# Patient Record
Sex: Male | Born: 1985 | Hispanic: Yes | Marital: Married | Smoking: Never smoker
Health system: Southern US, Community
[De-identification: ages and names within clinical notes are randomized; demographics above are authoritative.]

## PROBLEM LIST (undated history)

## (undated) DIAGNOSIS — I2510 Atherosclerotic heart disease of native coronary artery without angina pectoris: Secondary | ICD-10-CM

---

## 2021-08-23 ENCOUNTER — Emergency Department (HOSPITAL_COMMUNITY): Payer: Self-pay

## 2021-08-23 ENCOUNTER — Encounter (HOSPITAL_COMMUNITY): Payer: Self-pay | Admitting: Emergency Medicine

## 2021-08-23 ENCOUNTER — Inpatient Hospital Stay (HOSPITAL_COMMUNITY)
Admission: EM | Admit: 2021-08-23 | Discharge: 2021-08-25 | DRG: 885 | Disposition: A | Discharge status: Home / Self Care | Payer: Self-pay | Attending: Internal Medicine | Admitting: Internal Medicine

## 2021-08-23 ENCOUNTER — Other Ambulatory Visit: Payer: Self-pay

## 2021-08-23 DIAGNOSIS — T1491XA Suicide attempt, initial encounter: Secondary | ICD-10-CM

## 2021-08-23 DIAGNOSIS — M4802 Spinal stenosis, cervical region: Secondary | ICD-10-CM | POA: Diagnosis present

## 2021-08-23 DIAGNOSIS — R413 Other amnesia: Secondary | ICD-10-CM

## 2021-08-23 DIAGNOSIS — Z20822 Contact with and (suspected) exposure to covid-19: Secondary | ICD-10-CM | POA: Diagnosis present

## 2021-08-23 DIAGNOSIS — R200 Anesthesia of skin: Secondary | ICD-10-CM

## 2021-08-23 DIAGNOSIS — R569 Unspecified convulsions: Secondary | ICD-10-CM | POA: Diagnosis present

## 2021-08-23 DIAGNOSIS — F23 Brief psychotic disorder: Principal | ICD-10-CM | POA: Diagnosis present

## 2021-08-23 DIAGNOSIS — W19XXXA Unspecified fall, initial encounter: Secondary | ICD-10-CM | POA: Diagnosis present

## 2021-08-23 DIAGNOSIS — F29 Unspecified psychosis not due to a substance or known physiological condition: Secondary | ICD-10-CM | POA: Diagnosis present

## 2021-08-23 DIAGNOSIS — M4804 Spinal stenosis, thoracic region: Secondary | ICD-10-CM | POA: Diagnosis present

## 2021-08-23 DIAGNOSIS — Y900 Blood alcohol level of less than 20 mg/100 ml: Secondary | ICD-10-CM | POA: Diagnosis present

## 2021-08-23 DIAGNOSIS — R4182 Altered mental status, unspecified: Secondary | ICD-10-CM

## 2021-08-23 DIAGNOSIS — F17210 Nicotine dependence, cigarettes, uncomplicated: Secondary | ICD-10-CM | POA: Diagnosis present

## 2021-08-23 DIAGNOSIS — R440 Auditory hallucinations: Secondary | ICD-10-CM

## 2021-08-23 DIAGNOSIS — Z9181 History of falling: Secondary | ICD-10-CM

## 2021-08-23 DIAGNOSIS — M48061 Spinal stenosis, lumbar region without neurogenic claudication: Secondary | ICD-10-CM | POA: Diagnosis present

## 2021-08-23 DIAGNOSIS — F1020 Alcohol dependence, uncomplicated: Secondary | ICD-10-CM | POA: Diagnosis present

## 2021-08-23 DIAGNOSIS — G935 Compression of brain: Secondary | ICD-10-CM | POA: Diagnosis present

## 2021-08-23 DIAGNOSIS — Z9151 Personal history of suicidal behavior: Secondary | ICD-10-CM

## 2021-08-23 DIAGNOSIS — I2510 Atherosclerotic heart disease of native coronary artery without angina pectoris: Secondary | ICD-10-CM

## 2021-08-23 DIAGNOSIS — Y929 Unspecified place or not applicable: Secondary | ICD-10-CM

## 2021-08-23 DIAGNOSIS — S0003XA Contusion of scalp, initial encounter: Secondary | ICD-10-CM | POA: Diagnosis present

## 2021-08-23 DIAGNOSIS — M4800 Spinal stenosis, site unspecified: Secondary | ICD-10-CM

## 2021-08-23 DIAGNOSIS — R519 Headache, unspecified: Secondary | ICD-10-CM

## 2021-08-23 HISTORY — DX: Atherosclerotic heart disease of native coronary artery without angina pectoris: I25.10

## 2021-08-23 LAB — RAPID URINE DRUG SCREEN, HOSP PERFORMED
Amphetamines: NOT DETECTED
Barbiturates: NOT DETECTED
Benzodiazepines: NOT DETECTED
Cocaine: NOT DETECTED
Opiates: NOT DETECTED
Tetrahydrocannabinol: NOT DETECTED

## 2021-08-23 LAB — COMPREHENSIVE METABOLIC PANEL
ALT: 18 U/L (ref 0–44)
AST: 19 U/L (ref 15–41)
Albumin: 3.9 g/dL (ref 3.5–5.0)
Alkaline Phosphatase: 63 U/L (ref 38–126)
Anion gap: 7 (ref 5–15)
BUN: 9 mg/dL (ref 6–20)
CO2: 26 mmol/L (ref 22–32)
Calcium: 8.7 mg/dL — ABNORMAL LOW (ref 8.9–10.3)
Chloride: 105 mmol/L (ref 98–111)
Creatinine, Ser: 0.85 mg/dL (ref 0.61–1.24)
GFR, Estimated: >60 mL/min (ref >60)
Glucose, Bld: 77 mg/dL (ref 70–99)
Potassium: 3.8 mmol/L (ref 3.5–5.1)
Sodium: 138 mmol/L (ref 135–145)
Total Bilirubin: 0.8 mg/dL (ref 0.3–1.2)
Total Protein: 6.8 g/dL (ref 6.5–8.1)

## 2021-08-23 LAB — SALICYLATE LEVEL: Salicylate Lvl: <7 mg/dL — ABNORMAL LOW (ref 7.0–30.0)

## 2021-08-23 LAB — CBC WITH DIFFERENTIAL/PLATELET
Abs Immature Granulocytes: 0.02 10*3/uL (ref 0.00–0.07)
Basophils Absolute: 0 10*3/uL (ref 0.0–0.1)
Basophils Relative: 0 %
Eosinophils Absolute: 0 10*3/uL (ref 0.0–0.5)
Eosinophils Relative: 0 %
HCT: 43.1 % (ref 39.0–52.0)
Hemoglobin: 14.3 g/dL (ref 13.0–17.0)
Immature Granulocytes: 0 %
Lymphocytes Relative: 20 %
Lymphs Abs: 1.6 10*3/uL (ref 0.7–4.0)
MCH: 30.4 pg (ref 26.0–34.0)
MCHC: 33.2 g/dL (ref 30.0–36.0)
MCV: 91.5 fL (ref 80.0–100.0)
Monocytes Absolute: 0.6 10*3/uL (ref 0.1–1.0)
Monocytes Relative: 8 %
Neutro Abs: 5.4 10*3/uL (ref 1.7–7.7)
Neutrophils Relative %: 72 %
Platelets: 348 10*3/uL (ref 150–400)
RBC: 4.71 MIL/uL (ref 4.22–5.81)
RDW: 11.7 % (ref 11.5–15.5)
WBC: 7.7 10*3/uL (ref 4.0–10.5)
nRBC: 0 % (ref 0.0–0.2)

## 2021-08-23 LAB — ACETAMINOPHEN LEVEL: Acetaminophen (Tylenol), Serum: <10 ug/mL — ABNORMAL LOW (ref 10–30)

## 2021-08-23 LAB — RESP PANEL BY RT-PCR (FLU A&B, COVID) ARPGX2
Influenza A by PCR: NEGATIVE
Influenza B by PCR: NEGATIVE
SARS Coronavirus 2 by RT PCR: NEGATIVE

## 2021-08-23 LAB — ETHANOL: Alcohol, Ethyl (B): <10 mg/dL (ref <10)

## 2021-08-23 LAB — HIV ANTIBODY (ROUTINE TESTING W REFLEX): HIV Screen 4th Generation wRfx: NONREACTIVE

## 2021-08-23 LAB — AMMONIA: Ammonia: 29 umol/L (ref 9–35)

## 2021-08-23 IMAGING — CT CT HEAD W/O CM
4 series · 17 of 47 positions shown, 19 images · non-contrast
Comparison: None.

CLINICAL DATA: AMS s/p fall

EXAM:
CT HEAD WITHOUT CONTRAST
TECHNIQUE: Contiguous axial images were obtained from the base of the skull
through the vertex without intravenous contrast.

[Series 3: head bone · axial · 0.41mm/px · z∈[+1246,+1302]mm · 4 of 81 slices shown]
[im 9/81  bone]
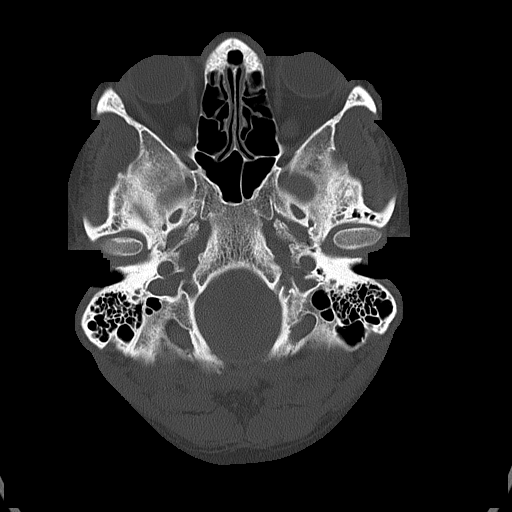
[im 17/81  bone]
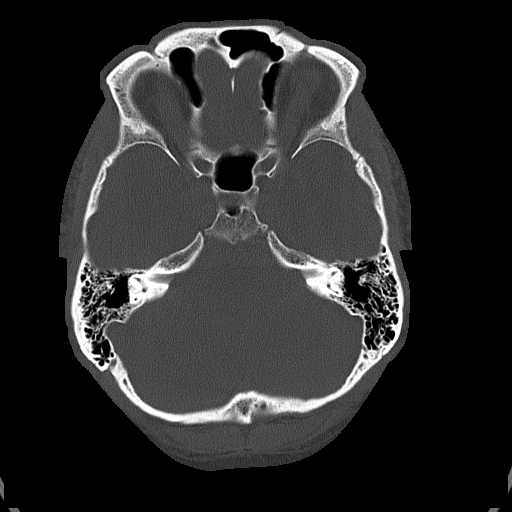
[im 25/81  bone]
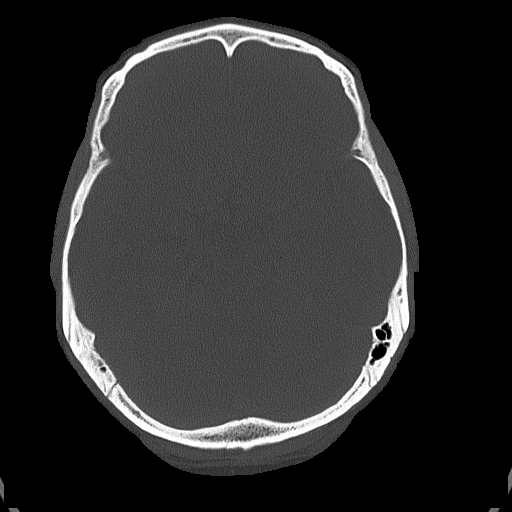
[im 37/81  bone]
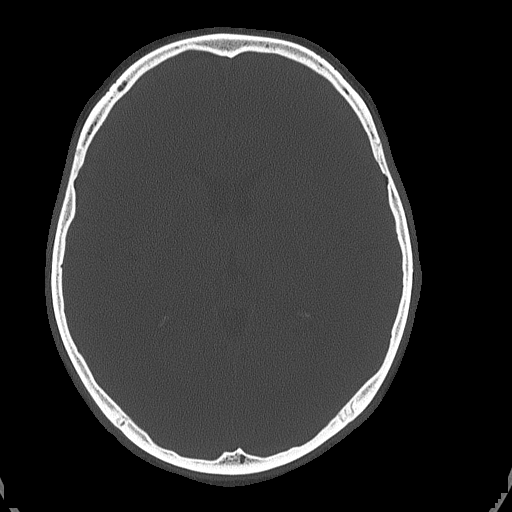

[Series 4: head without · axial · non-contrast · 0.41mm/px · z∈[+1250,+1370]mm · 7 of 33 slices shown, 9 images]
[im 5/33  brain]
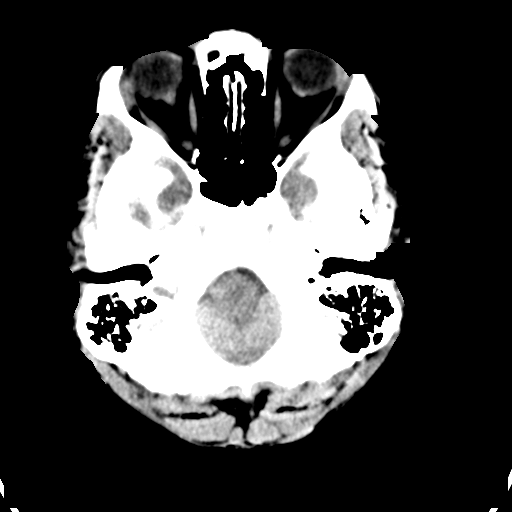
[im 5/33  bone]
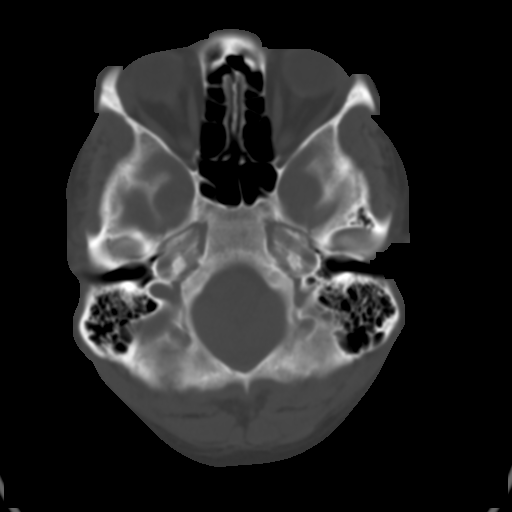
[im 9/33  brain]
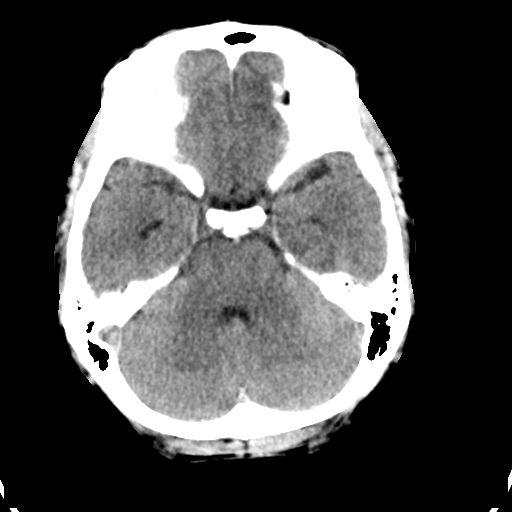
[im 13/33  brain]
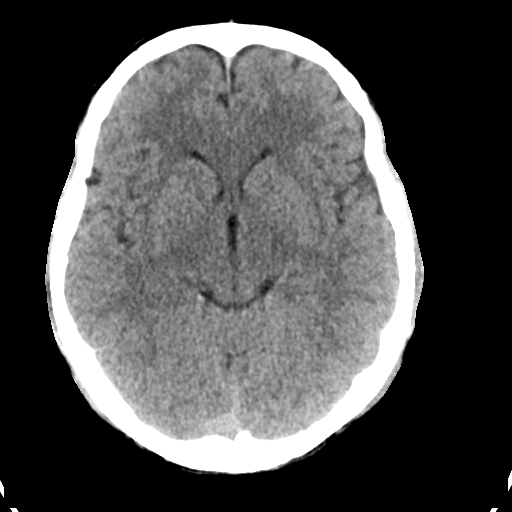
[im 17/33  brain]
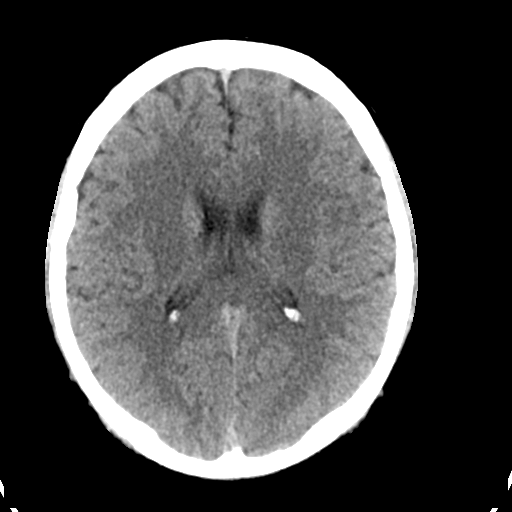
[im 21/33  brain]
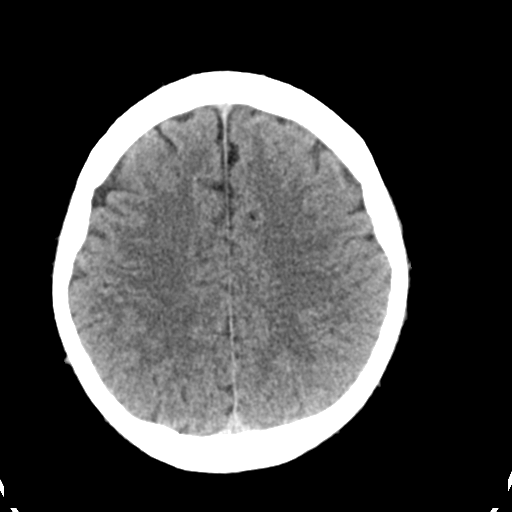
[im 21/33  bone]
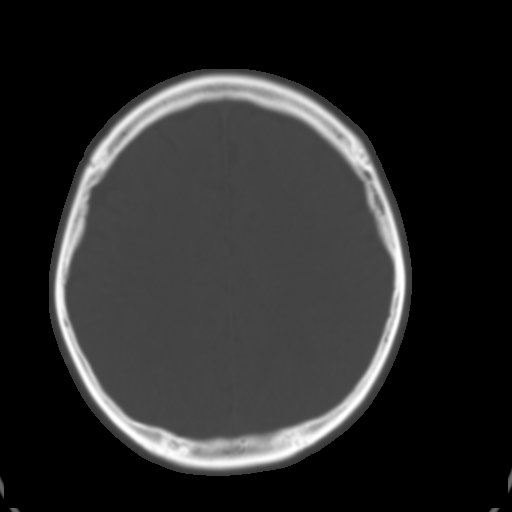
[im 25/33  brain]
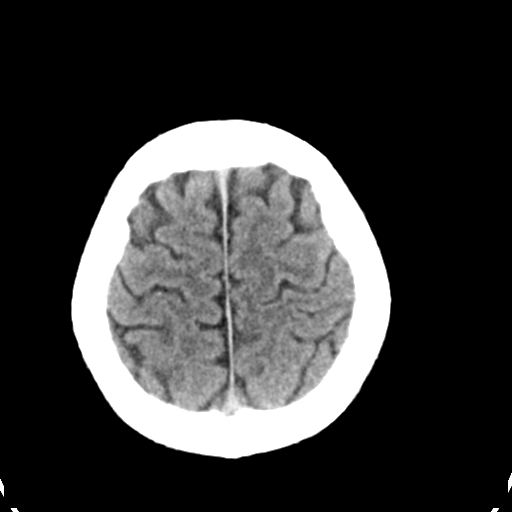
[im 29/33  brain]
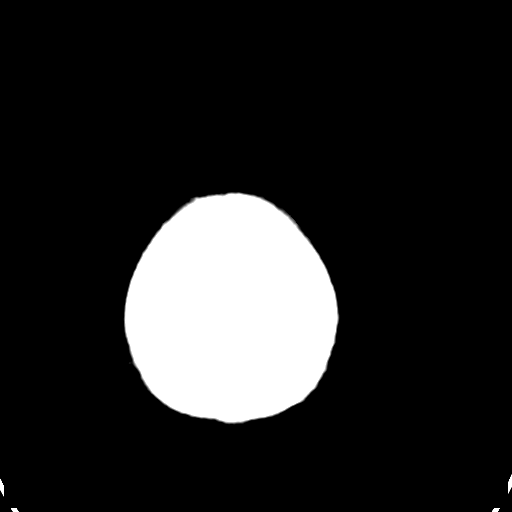

[Series 5: head without cor · coronal · non-contrast · 0.32mm/px · 3 of 66 slices shown]
[im 22/66  brain]
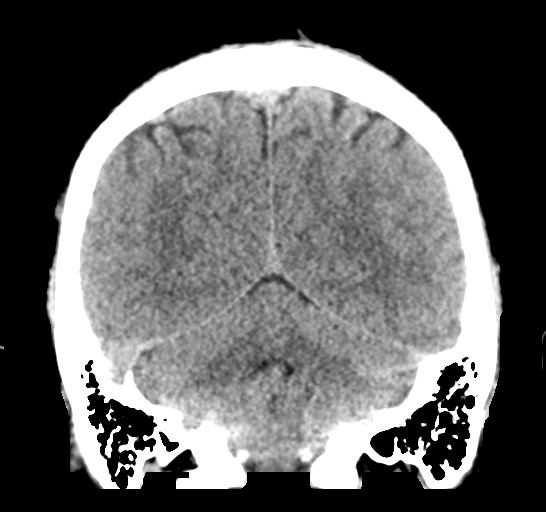
[im 29/66  brain]
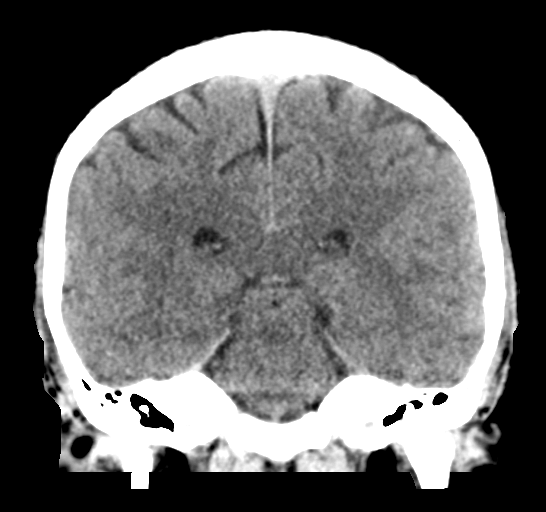
[im 37/66  brain]
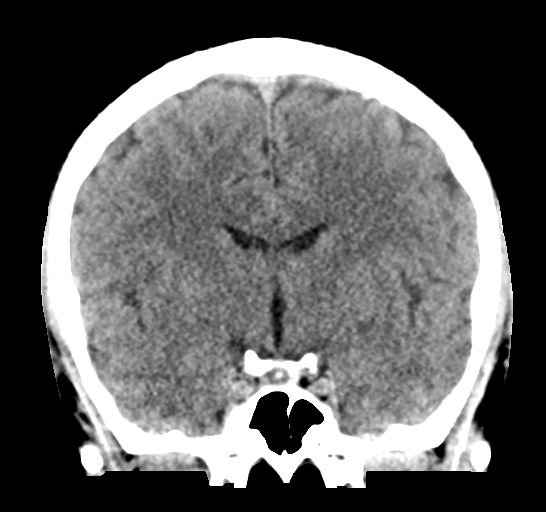

[Series 6: head without sag · sagittal · non-contrast · 0.34mm/px · 3 of 56 slices shown]
[im 19/56  brain]
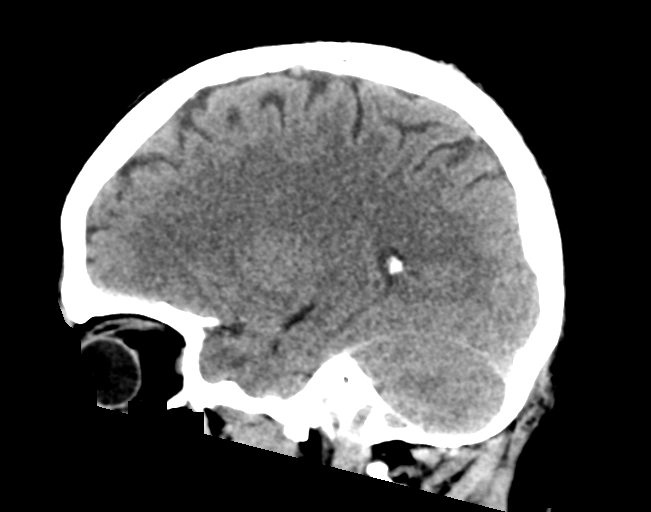
[im 28/56  brain]
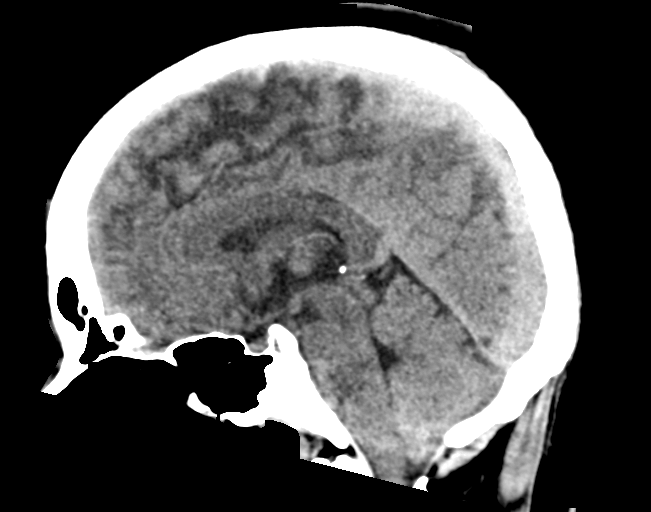
[im 37/56  brain]
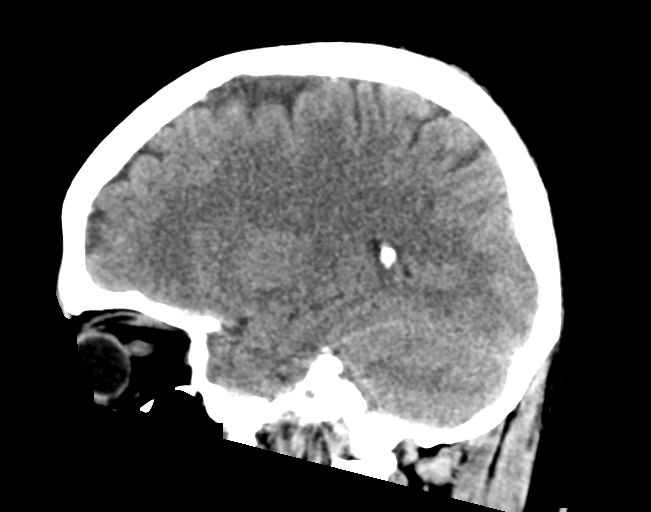

[17 of 47 positions shown; findings below may reference images not displayed]

FINDINGS: Brain: No evidence of acute infarction, hemorrhage, hydrocephalus,
extra-axial collection or mass lesion/mass effect.

Vascular: No hyperdense vessel identified.

Skull: Small right frontal scalp contusion without acute fracture.

Sinuses/Orbits: Clear visualized sinuses.  Unremarkable orbits.

Other: No mastoid effusions.
IMPRESSION: 1. No evidence of acute intracranial abnormality.
2. Small right frontal scalp contusion without acute fracture.

## 2021-08-23 IMAGING — MR MR LUMBAR SPINE W/O CM
4 of 5 series · 19 of 48 positions shown · non-contrast
Comparison: None.

CLINICAL DATA: Low back pain, progressive neurologic deficit

EXAM:
MRI LUMBAR SPINE WITHOUT CONTRAST
TECHNIQUE: Multiplanar, multisequence MR imaging of the lumbar spine was
performed. No intravenous contrast was administered.

[Series 17: T2 · sagittal · 4.0mm · 0.55mm/px · 6 of 15 slices shown (1 of 2)]
[im 1/15]
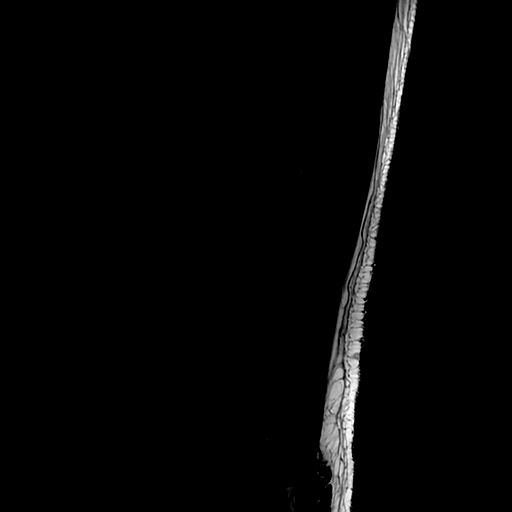
[im 3/15]
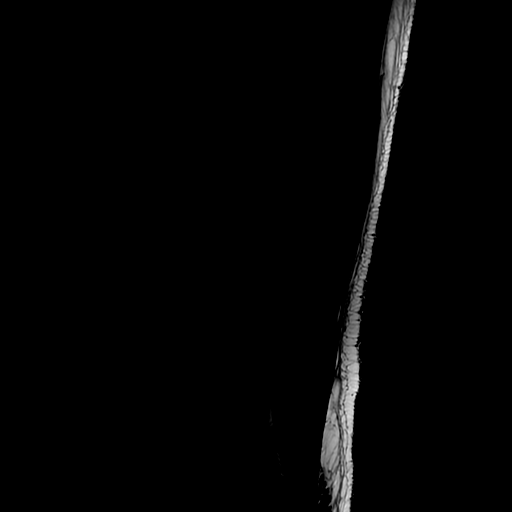
[im 6/15]
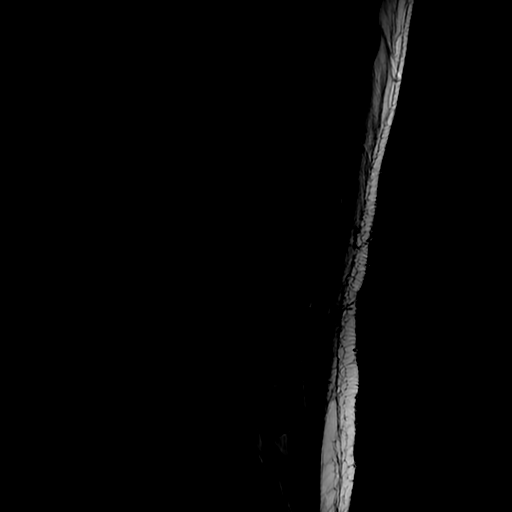
[im 9/15]
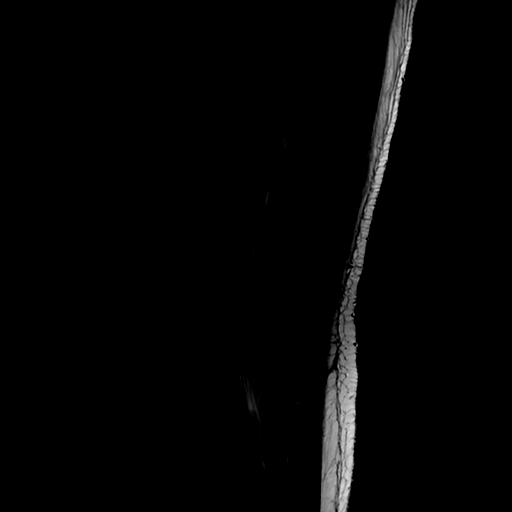
[im 12/15]
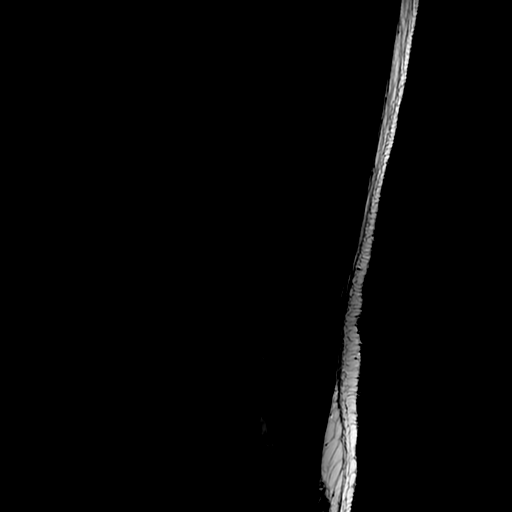
[im 15/15]
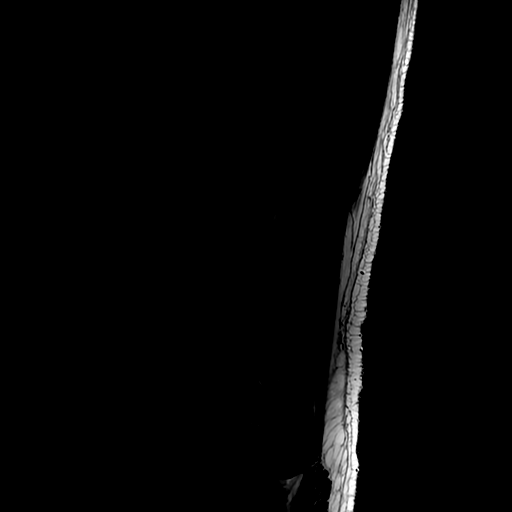

[Series 19: T1 · sagittal · 4.0mm · 0.55mm/px · 3 of 15 slices shown (1 of 2)]
[im 1/15]
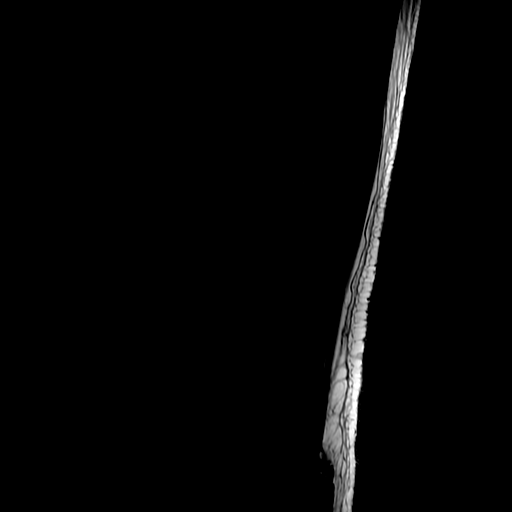
[im 8/15]
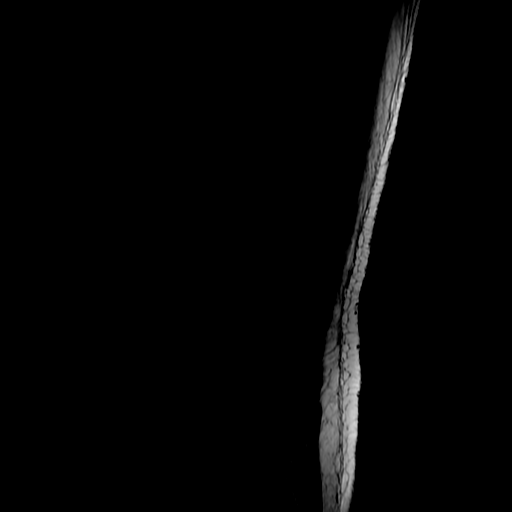
[im 15/15]
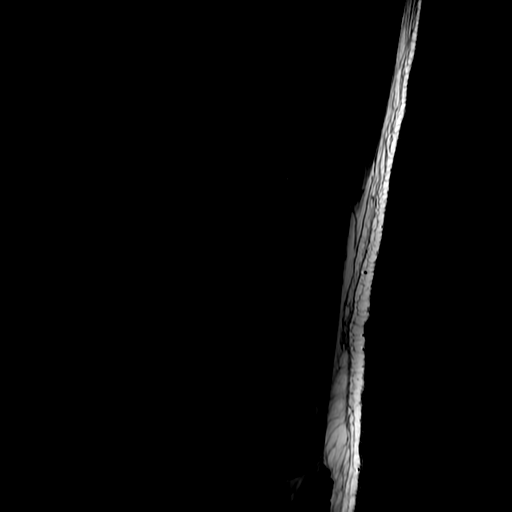

[Series 20: T2 · axial · 4.0mm · 0.39mm/px · z∈[-697,-500]mm · 7 of 43 slices shown (2 of 2)]
[im 3/43]
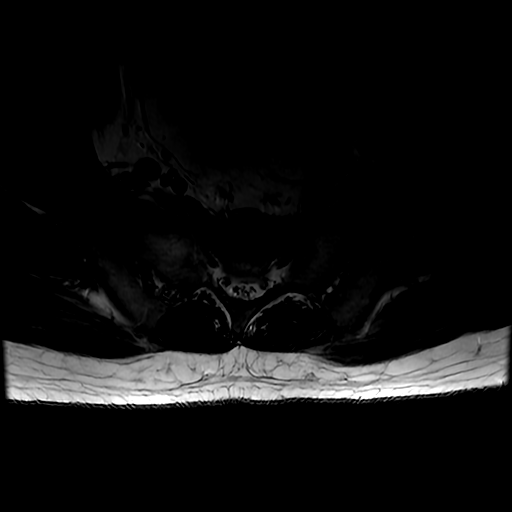
[im 6/43]
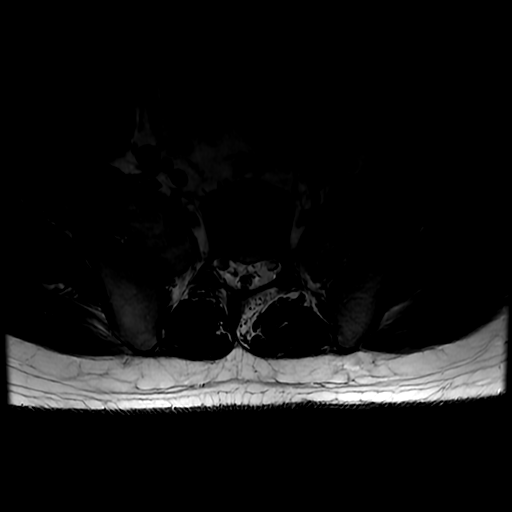
[im 9/43]
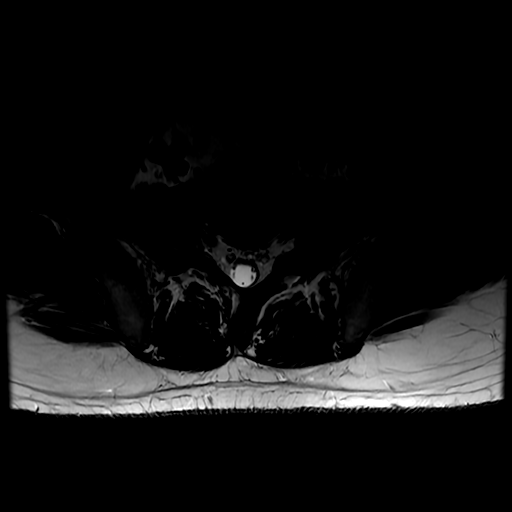
[im 15/43]
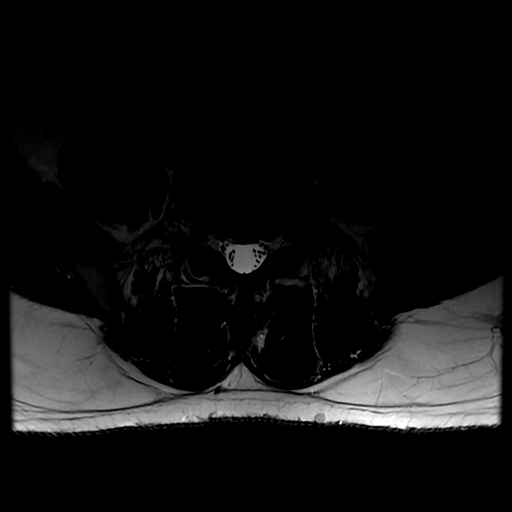
[im 20/43]
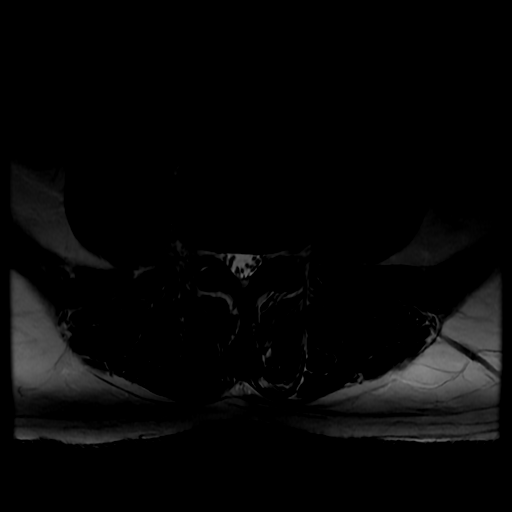
[im 23/43]
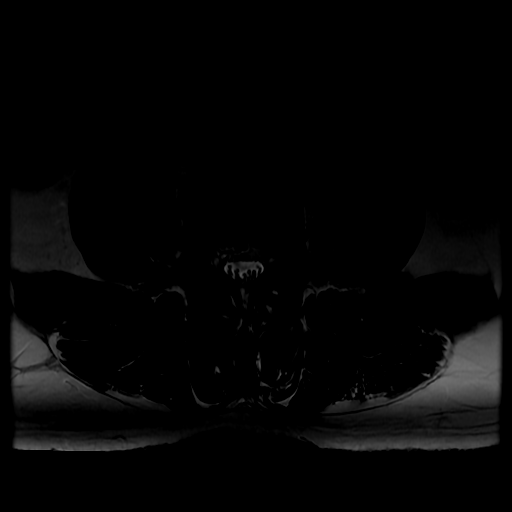
[im 37/43]
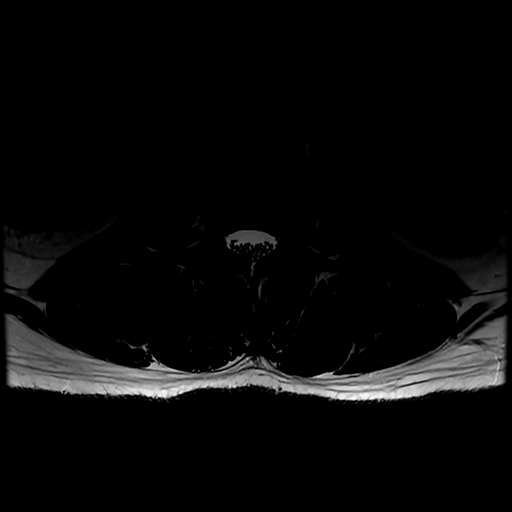

[Series 21: T1 · axial · 4.0mm · 0.39mm/px · z∈[-682,-500]mm · 3 of 43 slices shown (2 of 2)]
[im 6/43]
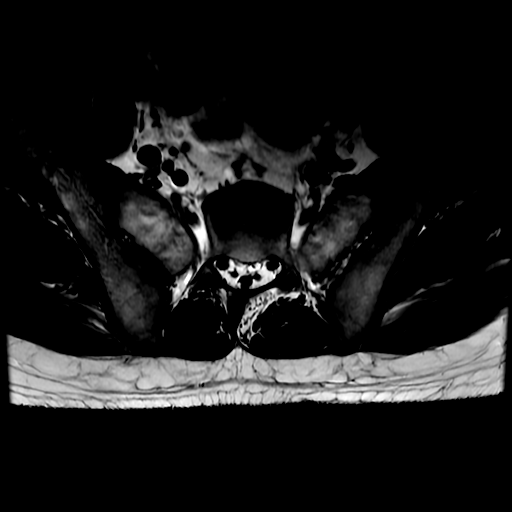
[im 23/43]
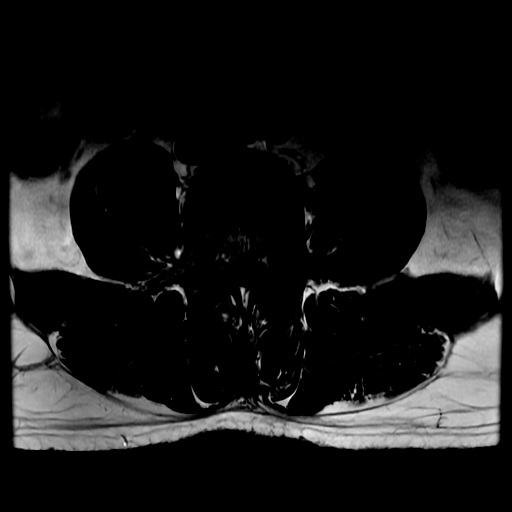
[im 37/43]
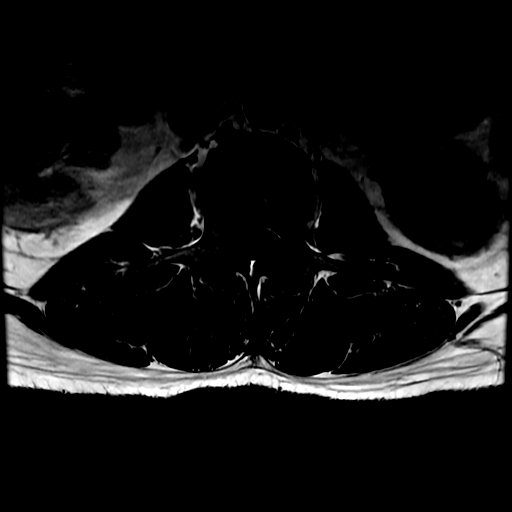

[19 of 48 positions shown; findings below may reference images not displayed]

FINDINGS: Segmentation: Transitional lumbosacral anatomy. For the purposes of
this dictation, there is partially lumbarized S1 segment.

Alignment: Straightening of the normal lumbar lordosis. No
substantial sagittal subluxation.

Vertebrae: Vertebral body heights are maintained. No focal marrow
edema to suggest acute fracture discitis/osteomyelitis. No
suspicious bone lesions.

Conus medullaris and cauda equina: Conus extends to the L1-L2 level.
Conus appears normal.

Paraspinal and other soft tissues: Unremarkable.

Disc levels:

T12-L1: No significant disc protrusion, foraminal stenosis, or canal
stenosis.

L1-L2: No significant disc protrusion, foraminal stenosis, or canal
stenosis.

L2-L3: Slight disc bulging and mild facet arthropathy. No
significant canal stenosis. Mild bilateral foraminal stenosis.

L3-L4: Disc desiccation height loss. Broad disc bulge and mild
bilateral facet hypertrophy. Resulting moderate canal stenosis and
mild bilateral foraminal stenosis.

L4-L5: Mild disc height loss and desiccation. Broad disc bulge and
mild bilateral facet arthropathy. Resulting mild canal stenosis and
mild bilateral foraminal stenosis.

L5-S1: No significant disc protrusion, foraminal stenosis, or canal
stenosis.
IMPRESSION: 1. Transitional lumbosacral anatomy. For the purposes of this
dictation, there is partially lumbarized S1 segment. Correlation
with radiographs is recommended prior to any operative intervention.
2. Moderate canal stenosis at L3-L4.  Mild canal stenosis at L4-L5.
3. Mild bilateral foraminal stenosis at L2-L3, L3-L4, and L4-L5.

## 2021-08-23 IMAGING — MR MR HEAD WO/W CM
7 of 13 series · 23 of 48 positions shown · IV contrast (gadavist)
Comparison: CT Head from the same day

CLINICAL DATA: Neuro deficit, acute, stroke suspected

EXAM:
MRI HEAD WITHOUT AND WITH CONTRAST
TECHNIQUE: Multiplanar, multiecho pulse sequences of the brain and surrounding
structures were obtained without and with intravenous contrast.
CONTRAST:  7mL GADAVIST GADOBUTROL 1 MMOL/ML IV SOLN

[Series 2: DWI · axial · 3.0mm · 0.94mm/px · z∈[-49,+104]mm · 7 of 103 slices shown (1 of 2)]
[im 1/103]
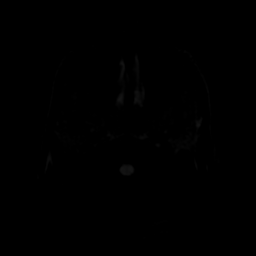
[im 18/103]
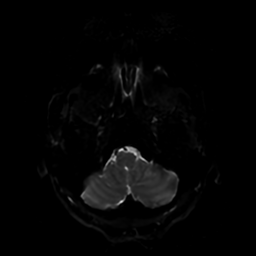
[im 35/103]
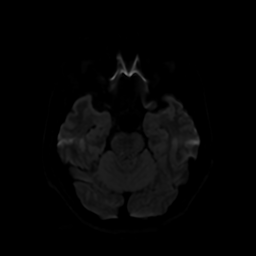
[im 52/103]
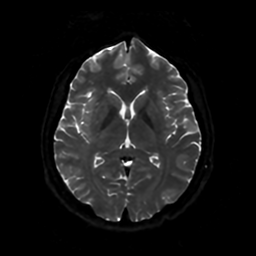
[im 69/103]
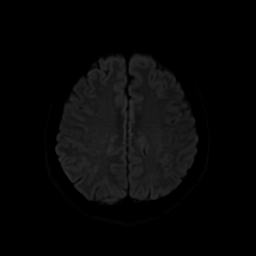
[im 86/103]
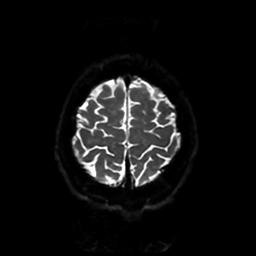
[im 103/103]
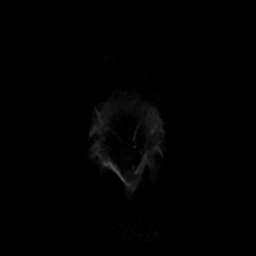

[Series 3: DWI · coronal · 4.0mm · 0.94mm/px · 5 of 76 slices shown (2 of 2)]
[im 1/76]
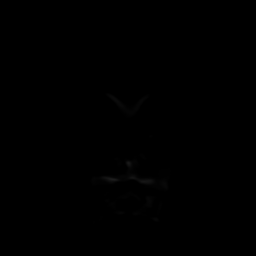
[im 19/76]
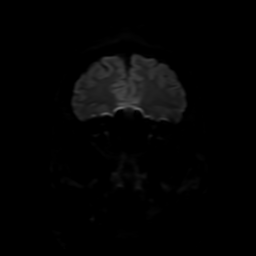
[im 38/76]
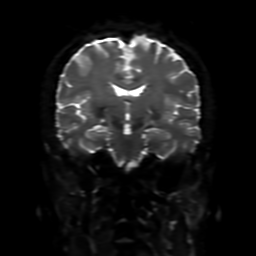
[im 57/76]
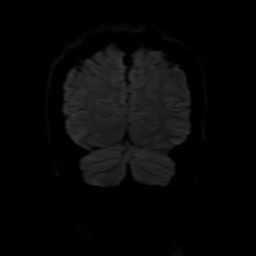
[im 76/76]
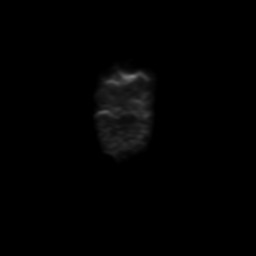

[Series 4: FLAIR · sagittal · 5.0mm · 0.23mm/px · 2 of 23 slices shown (1 of 2)]
[im 1/23]
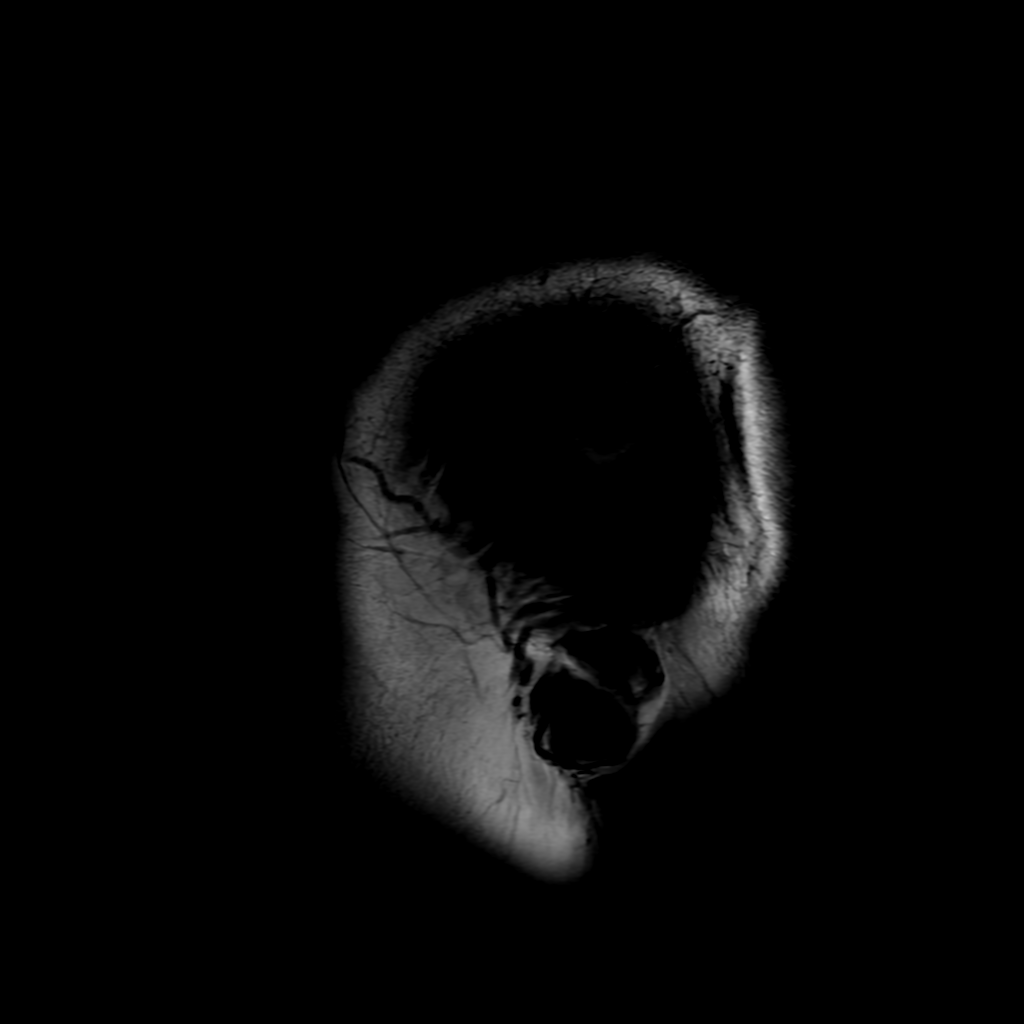
[im 23/23]
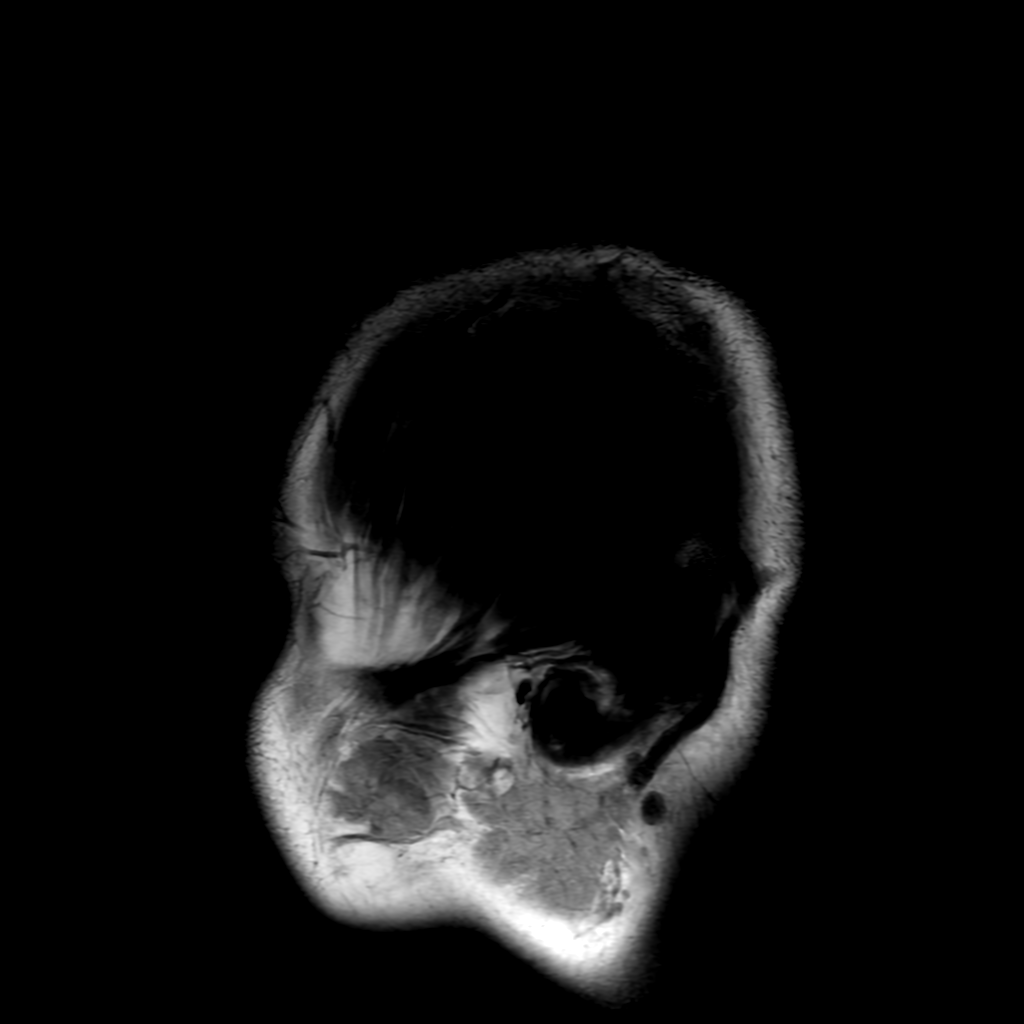

[Series 5: T2 · axial · 5.0mm · 0.23mm/px · 1 of 26 slices shown]
[im 1/26]
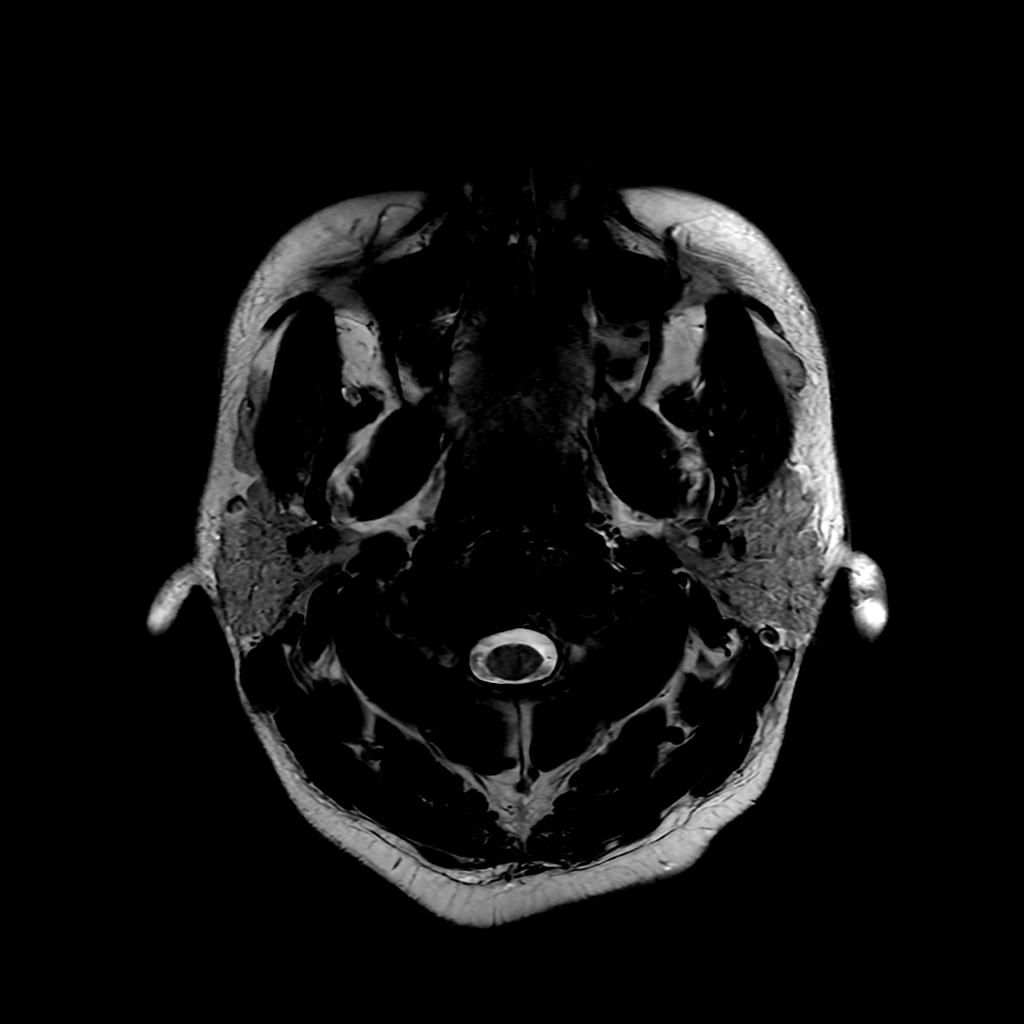

[Series 6: FLAIR · axial · 4.0mm · 0.47mm/px · z∈[-59,+91]mm · 2 of 35 slices shown (2 of 2)]
[im 1/35]
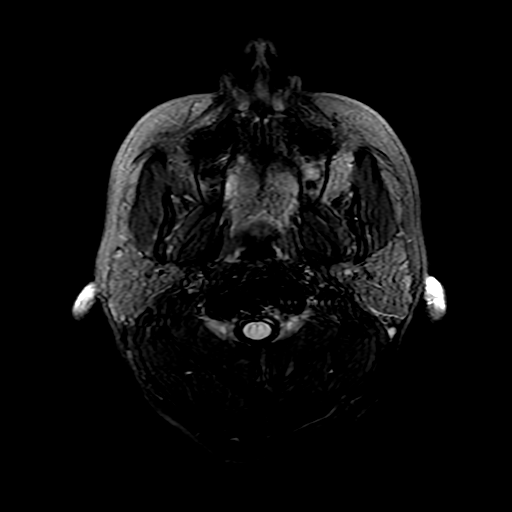
[im 35/35]
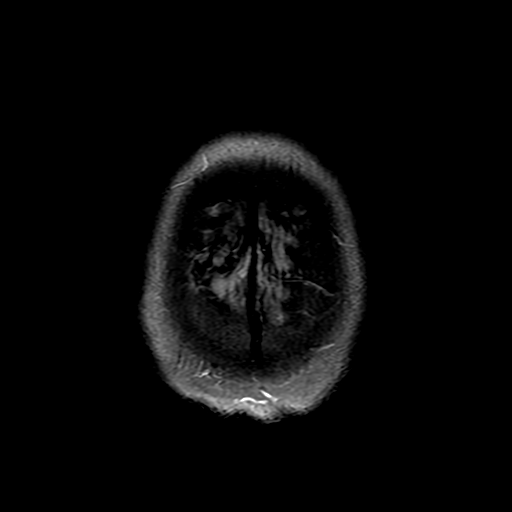

[Series 250: ADC · axial · 3.0mm · 0.94mm/px · z∈[-49,+104]mm · 3 of 52 slices shown (1 of 2)]
[im 1/52]
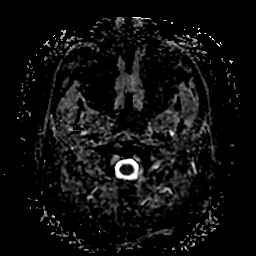
[im 26/52]
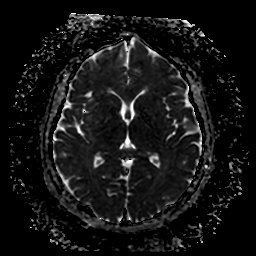
[im 52/52]
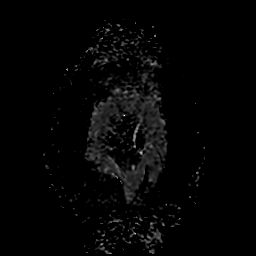

[Series 350: ADC · coronal · 4.0mm · 0.94mm/px · 3 of 38 slices shown (2 of 2)]
[im 1/38]
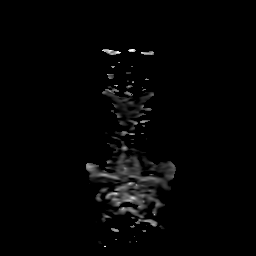
[im 19/38]
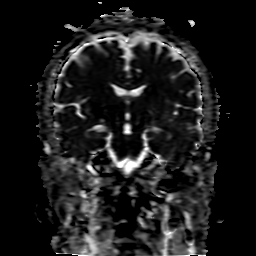
[im 38/38]
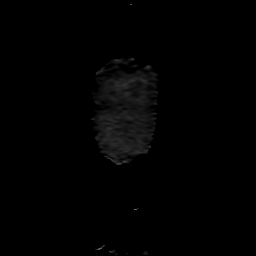

[23 of 48 positions shown; findings below may reference images not displayed]

FINDINGS: Brain: No acute infarction, hemorrhage, hydrocephalus, extra-axial
collection or mass lesion. Mild frontal predominant T2
hyperintensities in the white matter. The cerebellar tonsils extend
up to 6 mm below the foramen magnum with mildly pointed morphology
on the right and mild crowding of the posterior fossa. No abnormal
enhancement.

Vascular: Major arterial flow voids are maintained skull base.

Skull and upper cervical spine: Normal marrow signal.

Sinuses/Orbits: Mild paranasal sinus mucosal thickening.

Other: Trace mastoid effusions.
IMPRESSION: 1. No acute infarct.
2. The cerebellar tonsils extend up to 6 mm below the foramen magnum
with mildly pointed morphology on the right and mild crowding of the
posterior fossa. Findings are suggestive of a mild Chiari I
malformation.
3. Mild frontal predominant T2 hyperintensities in the white matter.
This finding is nonspecific but can be seen in the setting of
chronic microvascular ischemia, a demyelinating process such as
multiple sclerosis, or chronic migraines.

## 2021-08-23 IMAGING — MR MR CERVICAL SPINE W/O CM
4 of 5 series · 19 of 48 positions shown · non-contrast
Comparison: None.

EXAM:
MRI CERVICAL SPINE WITHOUT CONTRAST
TECHNIQUE: Multiplanar, multisequence MR imaging of the cervical spine was
performed. No intravenous contrast was administered.

[Series 10: T2 · sagittal · 3.0mm · 0.43mm/px · 5 of 16 slices shown (1 of 2)]
[im 1/16]
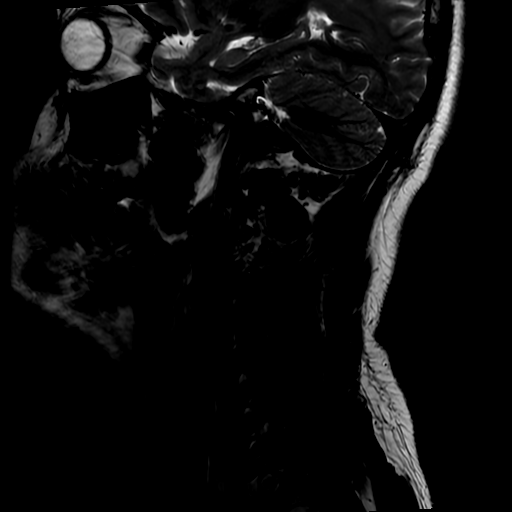
[im 4/16]
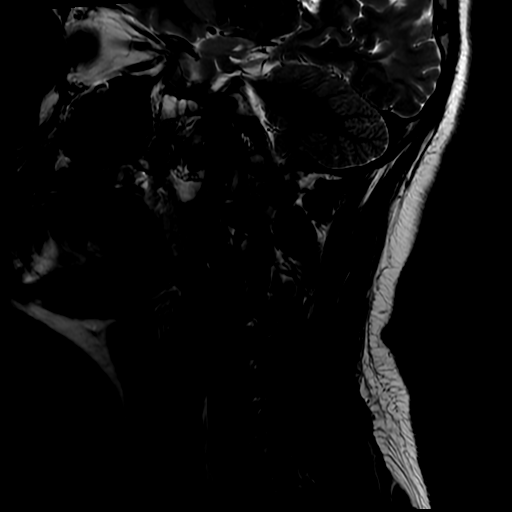
[im 8/16]
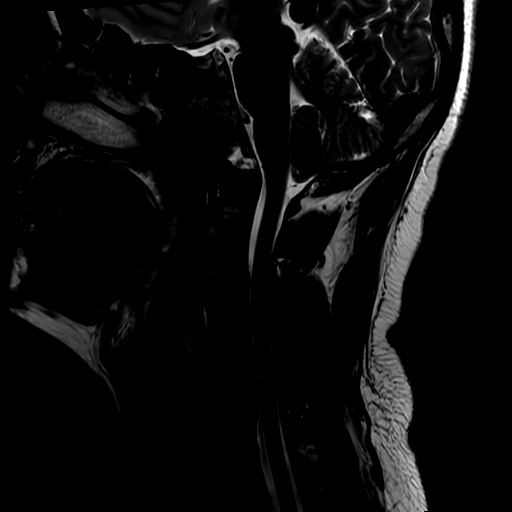
[im 12/16]
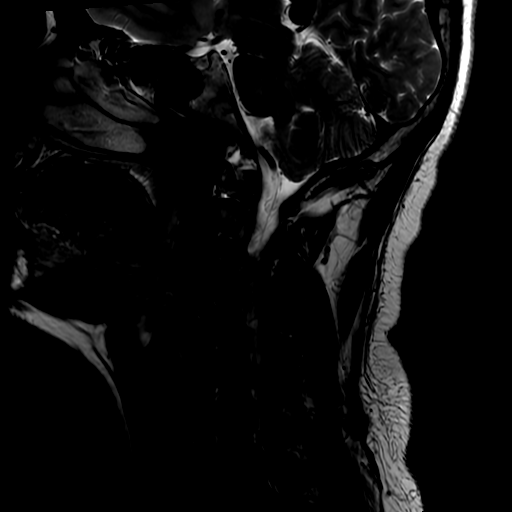
[im 16/16]
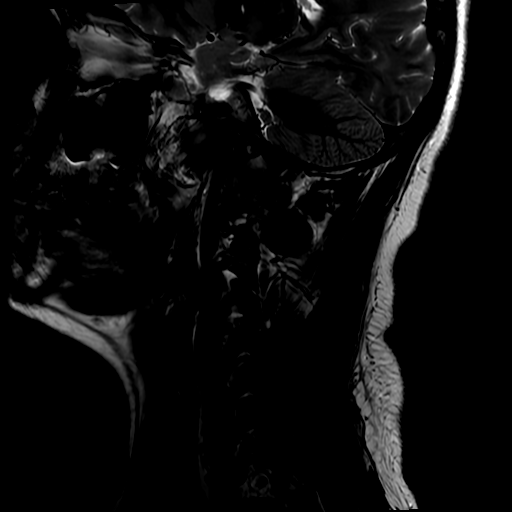

[Series 11: FLAIR · sagittal · 3.0mm · 0.43mm/px · 3 of 16 slices shown]
[im 1/16]
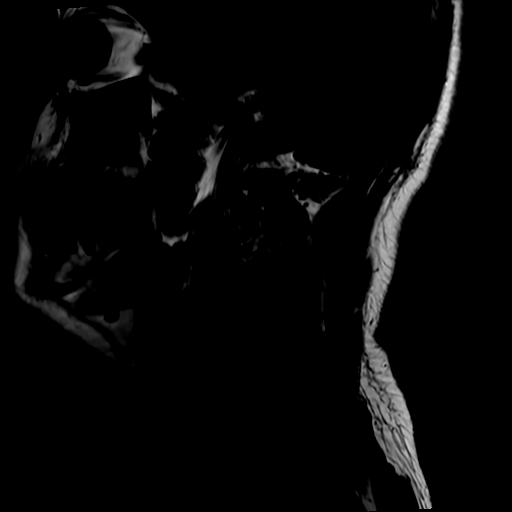
[im 11/16]
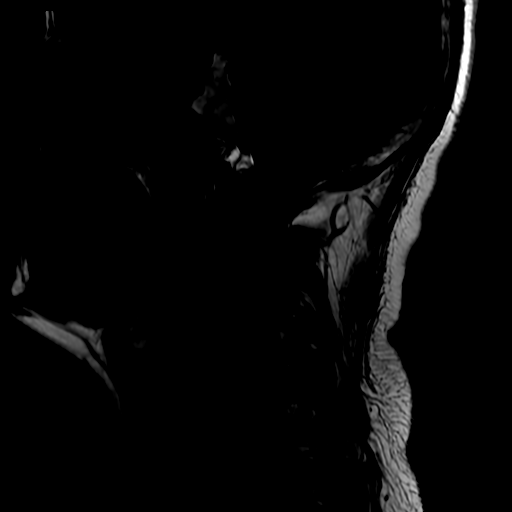
[im 16/16]
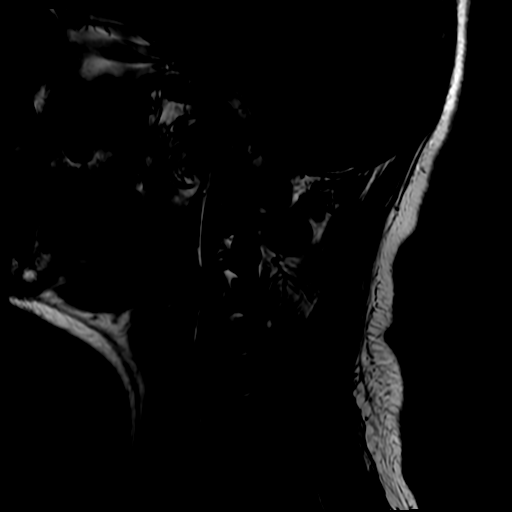

[Series 12: STIR · sagittal · 3.0mm · 0.43mm/px · 3 of 16 slices shown]
[im 1/16]
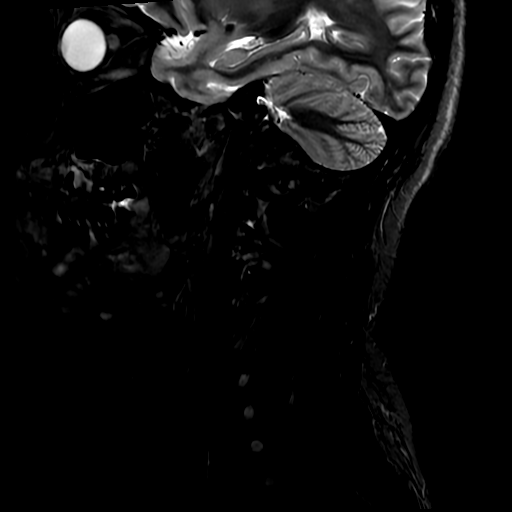
[im 11/16]
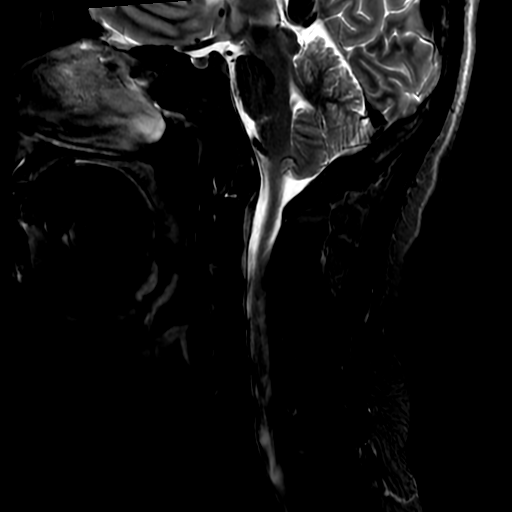
[im 16/16]
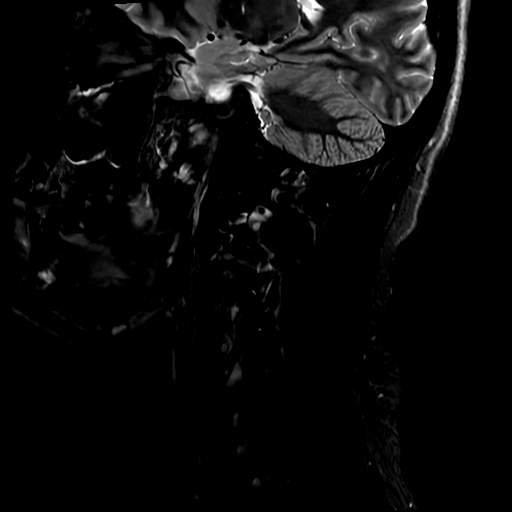

[Series 16: T2 · axial · 3.0mm · 0.35mm/px · z∈[-202,-117]mm · 8 of 32 slices shown (2 of 2)]
[im 1/32]
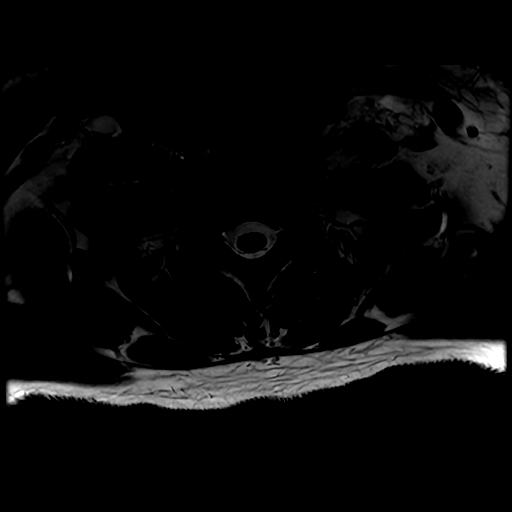
[im 4/32]
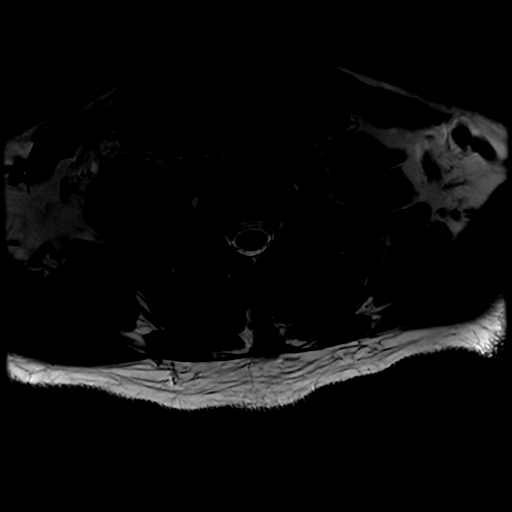
[im 8/32]
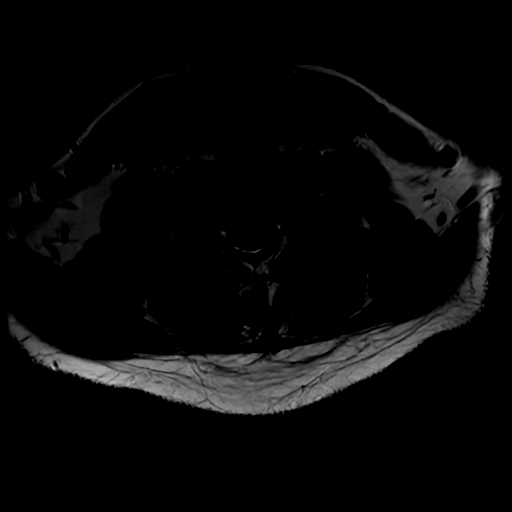
[im 12/32]
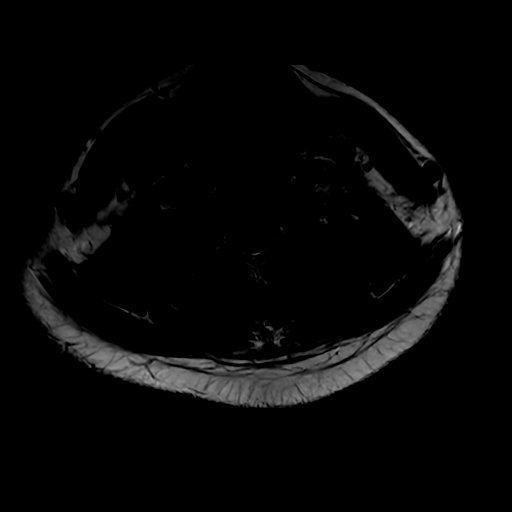
[im 16/32]
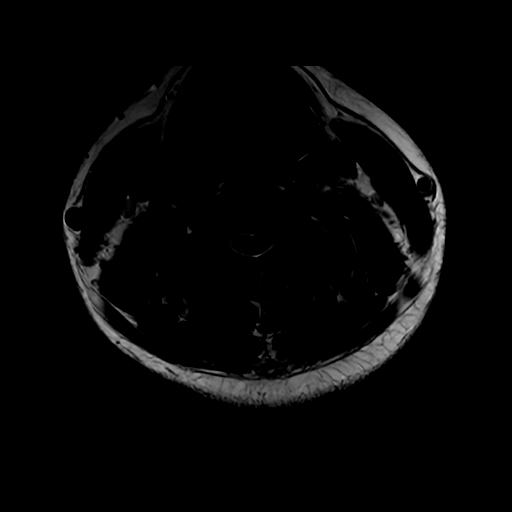
[im 20/32]
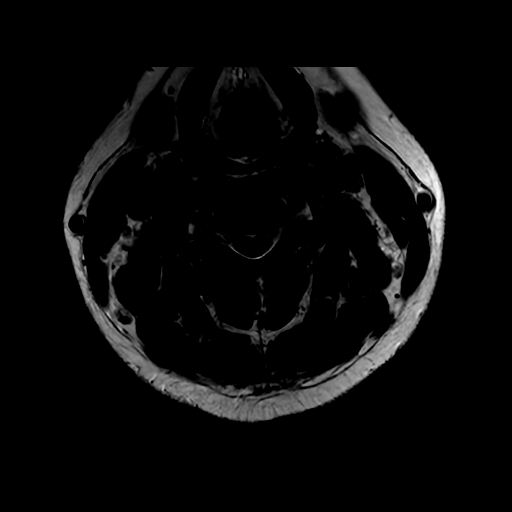
[im 24/32]
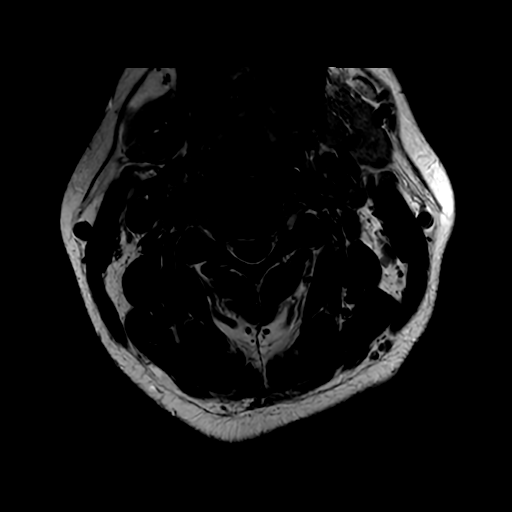
[im 28/32]
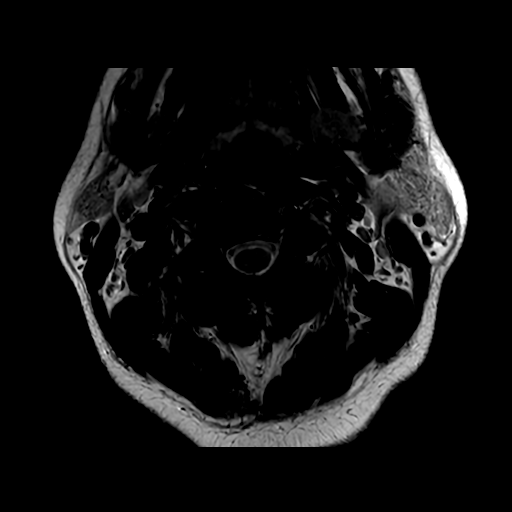

[19 of 48 positions shown; findings below may reference images not displayed]

FINDINGS: Alignment: Straightening of the normal cervical lordosis. No
substantial sagittal subluxation.

Vertebrae: Vertebral body heights are maintained. No focal marrow
edema to suggest acute fracture or discitis/osteomyelitis.

Cord: Normal cord signal.

Posterior Fossa, vertebral arteries, paraspinal tissues: Please see
concurrent MRI brain for evaluation of posterior fossa and
description of inferior cerebellar tonsillar descent. Visualized
vertebral artery flow voids are maintained.

Disc levels:

C2-C3: No significant disc protrusion, foraminal stenosis, or canal
stenosis.

C3-C4: Small right eccentric posterior disc osteophyte complex and
right greater than left facet and uncovertebral hypertrophy.
Resulting moderate right and mild left foraminal stenosis. Mild
canal stenosis.

C4-C5: Small central disc protrusion which contacts and flattens the
ventral cord. Mild canal stenosis without significant foraminal
stenosis.

C5-C6: Small central disc protrusion which contacts and flattens the
ventral cord. Right greater than left uncovertebral hypertrophy.
Mild canal stenosis without significant foraminal stenosis.

C6-C7: Posterior disc osteophyte complex and mild bilateral
uncovertebral hypertrophy. Mild bilateral foraminal stenosis and
mild canal stenosis.

C7-T1: No significant disc protrusion, foraminal stenosis, or canal
stenosis.
IMPRESSION: 1. Mild canal stenosis at C3-C4, C4-C5, C5-C6, and C6-C7. Disc
flattens the ventral cord at C4-C5 and C5-C6.
2. Moderate right and mild left foraminal stenosis at C3-C4.
3. Mild bilateral foraminal stenosis at C6-C7.
4. Please see concurrent MRI brain for description of inferior
cerebellar tonsillar descent.

## 2021-08-23 MED ORDER — GADOBUTROL 1 MMOL/ML IV SOLN
7.0000 mL | Freq: Once | INTRAVENOUS | Status: AC | PRN
  Administered 2021-08-23: 7 mL via INTRAVENOUS

## 2021-08-23 MED ORDER — DIVALPROEX SODIUM 125 MG PO CSDR
500.0000 mg | DELAYED_RELEASE_CAPSULE | Freq: Two times a day (BID) | ORAL | Status: DC
  Administered 2021-08-24 – 2021-08-25 (×3): 500 mg via ORAL
  Filled 2021-08-23 (×6): qty 4

## 2021-08-23 MED ORDER — DIVALPROEX SODIUM 125 MG PO CSDR
1500.0000 mg | DELAYED_RELEASE_CAPSULE | Freq: Once | ORAL | Status: AC
  Administered 2021-08-23: 1500 mg via ORAL
  Filled 2021-08-23: qty 12

## 2021-08-23 MED ORDER — LIDOCAINE HCL (PF) 1 % IJ SOLN
INTRAMUSCULAR | Status: AC
  Administered 2021-08-23: 30 mL
  Filled 2021-08-23: qty 30

## 2021-08-23 NOTE — ED Provider Notes (Signed)
Patient handed off to me at 3:30 PM.  Overall difficult historian.  Here for confusion and altered mental status.  Possibly a history of CAD.  Has had confusion, auditory hallucinations and intermittent right-sided headache with photophobia and phonophobia.  Possibly over the last 2 days but may be longer.  Patient's wife states that maybe a couple months ago he was having some right arm shaking.  He is confused after these events.  Patient supposedly was being treated by cardiology for CAD and was required to take a medication to keep her from sleeping for more than 2 hours/day.  He has maybe been taking some sleeping pills but has not taken any pills in several days.  Maybe this was a suicide attempt per wife.  Overall he has quit smoking several days ago.  Does not supposedly drink or do any drugs.  She has no history of traumatic brain injury, no history of seizures or other significant illness.  No psychiatric illness.  History is very difficult.  I have consented him for lumbar puncture per neurology recommendations after unremarkable lab work and MRI of his brain and spine.  He does have a small Chiari malformation but neurology does not think that this is consistent with his symptoms.  Lumbar puncture to be done to try and evaluate for more autoimmune encephalopathy and other possible neurologic issues.  Lumbar puncture by myself was unsuccessful.  Will need IR LP.  Overall hemodynamically he is stable.  Neurology work-up needs to be completed and then likely psychiatry evaluation if neurology work-up is unremarkable.  Admitted to medicine.  This chart was dictated using voice recognition software.  Despite best efforts to proofread,  errors can occur which can change the documentation meaning.  .Lumbar Puncture  Date/Time: 08/23/2021 9:05 PM Performed by: Virgina Norfolk, DO Authorized by: Virgina Norfolk, DO   Consent:    Consent obtained:  Written   Consent given by:  Patient   Risks, benefits,  and alternatives were discussed: yes     Risks discussed:  Bleeding, headache, nerve damage, infection, pain and repeat procedure   Alternatives discussed:  No treatment and delayed treatment Universal protocol:    Procedure explained and questions answered to patient or proxy's satisfaction: yes     Relevant documents present and verified: yes     Test results available: yes     Imaging studies available: yes     Required blood products, implants, devices, and special equipment available: yes     Patient identity confirmed:  Verbally with patient and arm band Pre-procedure details:    Procedure purpose:  Diagnostic   Preparation: Patient was prepped and draped in usual sterile fashion   Anesthesia:    Anesthesia method:  None Procedure details:    Lumbar space:  L4-L5 interspace   Patient position:  Sitting   Needle gauge:  18   Needle type:  Spinal needle - Quincke tip   Needle length (in):  3.5   Ultrasound guidance: no     Number of attempts:  2 Post-procedure details:    Puncture site:  Adhesive bandage applied   Procedure completion:  Tolerated Comments:     Failed attempts x 2     Virgina Norfolk, DO 08/23/21 2105

## 2021-08-23 NOTE — ED Notes (Signed)
Patient transported to MRI 

## 2021-08-23 NOTE — ED Triage Notes (Signed)
Pt here from home for fall a week ago. Pt does not remember fall, but per wife since fall he has been forgetful, not recognizing friends/kids. Per wife pt left home for a day, they couldn't find him. PT endorses hearing voices. Pt reports intermittent HA, denies SI/HI. Per wife pt took a bottle of sleeping pills 2 weeks ago.

## 2021-08-23 NOTE — ED Provider Notes (Signed)
[facility] EMERGENCY DEPARTMENT Provider Note   CSN: [id number] Arrival date & time: 08/23/21  1119     History Chief Complaint  Patient presents with   Fall   Altered Mental Status    Lawrence George is a 35 y.o. male.  HPI    History limited by language, spanish interpreter used 35yo male with possible history of CAD (per family, not on medications, unable to view records), who presents with concern for syncopal event last week with head injury, followed by episode of altered mental status, not remembering family or where he lived, being found with right arm shaking, numbness or right side of face, arm and leg, decreased vision on right, with right sided headaches for one week, hearing voices for 3 days, and also concern for ingestion of sleeping pills weeks ago for which he denies SI.    Reports he fell and hit his head a week ago Hx of CAD, was having chest pain and dyspnea and passing out, passed out and hit his head 1 week ago After that, Left the house, didn't return, was lost last night and they couldn't find him Right arm was shaking when they found him in [place] Didn't remember where he lived or his family Once he went home he felt better He left the house and he got lost  Hearing voices, having pain on the right side of the head When it happens grabs his head and says that voices are talking to him No hx of prior schizophrenia Reports he received an IV treatment for 3-4 hours for his arteries, gave him medications for sleeping, after that he began acting like this, about one month ago?  No hx of alchol withdrawal, just had drinks at parties but has not for a while.  Not on medications now Whent they stopped the medicines to help him sleep and the Right foot with pain and numbness/cramps, right back pain that radiates down the right leg Sees a little less out of the right side, head hurting on the right side, difficulty hearing out of the  right ear Some words that cna't say quickly Stopped the medication for sleep and medication under tongue 15 days before took 2 bottles of pills, because he couldn't sleep and wanted to sleep , denies SI   Has been hearing the voices for 3 days  Wife reports he locks himself in his room and sings and talks to himself   No fevers  Past Medical History:  Diagnosis Date   Coronary artery disease     There are no problems to display for this patient.   History reviewed. No pertinent surgical history.     History reviewed. No pertinent family history.  Social History   Tobacco Use   Smoking status: Never   Smokeless tobacco: Never  Substance Use Topics   Alcohol use: Not Currently   Drug use: Never    Home Medications Prior to Admission medications   Not on File    Allergies    Patient has no known allergies.  Review of Systems   Review of Systems  Constitutional:  Positive for fatigue. Negative for fever.  Respiratory:  Negative for cough and shortness of breath (last week not today).   Cardiovascular:  Negative for chest pain (last week, not today).  Gastrointestinal:  Positive for nausea and vomiting.  Musculoskeletal:  Positive for back pain.  Skin:  Negative for rash.  Neurological:  Positive for syncope, speech difficulty, numbness  and headaches. Negative for facial asymmetry and weakness.  Psychiatric/Behavioral:  Positive for behavioral problems, confusion, hallucinations and sleep disturbance. Suicidal ideas: denies, reports he took medications to sleep.   Physical Exam Updated Vital Signs BP 115/71   Pulse 67   Temp 98.6 F (37 C) (Oral)   Resp 15   SpO2 97%   Physical Exam Constitutional:      General: He is not in acute distress.    Appearance: Normal appearance. He is not ill-appearing.  HENT:     Head: Normocephalic and atraumatic.  Eyes:     General: Visual field deficit (right sided visual field noted on rifht) present.     Extraocular  Movements: Extraocular movements intact.     Conjunctiva/sclera: Conjunctivae normal.     Pupils: Pupils are equal, round, and reactive to light.  Cardiovascular:     Rate and Rhythm: Normal rate and regular rhythm.     Pulses: Normal pulses.  Pulmonary:     Effort: Pulmonary effort is normal. No respiratory distress.  Musculoskeletal:        General: No swelling or tenderness.     Cervical back: Normal range of motion.  Skin:    General: Skin is warm and dry.     Findings: No erythema or rash.  Neurological:     General: No focal deficit present.     Mental Status: He is alert and oriented to person, place, and time.     GCS: GCS eye subscore is 4. GCS verbal subscore is 5. GCS motor subscore is 6.     Cranial Nerves: No cranial nerve deficit, dysarthria or facial asymmetry (poor effort but no asymmetry).     Sensory: Sensory deficit (right face, arm, leg) present.     Motor: No weakness (right proximal leg mild), tremor or pronator drift.     Coordination: Coordination normal. Finger-Nose-Finger Test normal.     Gait: Gait normal.    ED Results / Procedures / Treatments   Labs (all labs ordered are listed, but only abnormal results are displayed) Labs Reviewed  COMPREHENSIVE METABOLIC PANEL - Abnormal; Notable for the following components:      Result Value   Calcium 8.7 (*)    All other components within normal limits  SALICYLATE LEVEL - Abnormal; Notable for the following components:   Salicylate Lvl <7.0 (*)    All other components within normal limits  ACETAMINOPHEN LEVEL - Abnormal; Notable for the following components:   Acetaminophen (Tylenol), Serum <10 (*)    All other components within normal limits  ETHANOL  RAPID URINE DRUG SCREEN, HOSP PERFORMED  CBC WITH DIFFERENTIAL/PLATELET  AMMONIA  CBC WITH DIFFERENTIAL/PLATELET    EKG EKG Interpretation  Date/Time:  Wednesday August 23 2021 13:31:01 EDT Ventricular Rate:  79 PR Interval:  103 QRS  Duration: 96 QT Interval:  355 QTC Calculation: 407 R Axis:   57 Text Interpretation: Sinus rhythm Short PR interval ST elev, probable normal early repol pattern No previous ECGs available Confirmed by Alvira Monday ([id number]) on 08/23/2021 3:49:03 PM  Radiology CT Head Wo Contrast  Result Date: 08/23/2021 CLINICAL DATA:  AMS s/p fall EXAM: CT HEAD WITHOUT CONTRAST TECHNIQUE: Contiguous axial images were obtained from the base of the skull through the vertex without intravenous contrast. COMPARISON:  None. FINDINGS: Brain: No evidence of acute infarction, hemorrhage, hydrocephalus, extra-axial collection or mass lesion/mass effect. Vascular: No hyperdense vessel identified. Skull: Small right frontal scalp contusion without acute fracture. Sinuses/Orbits: Clear visualized sinuses.  Unremarkable orbits. Other: No mastoid effusions. IMPRESSION: 1. No evidence of acute intracranial abnormality. 2. Small right frontal scalp contusion without acute fracture. Electronically Signed   By: Feliberto Harts M.D.   On: 08/23/2021 13:33    Procedures Procedures   Medications Ordered in ED Medications - No data to display  ED Course  I have reviewed the triage vital signs and the nursing notes.  Pertinent labs & imaging results that were available during my care of the patient were reviewed by me and considered in my medical decision making (see chart for details).    MDM Rules/Calculators/A&P                           35yo male with possible history of CAD (per family, not on medications, unable to view records), who presents with concern for syncopal event last week with head injury, followed by episode of altered mental status, not remembering family or where he lived, being found with right arm shaking, numbness or right side of face, arm and leg, decreased vision on right, with right sided headaches for one week, hearing voices for 3 days, and also concern for ingestion of sleeping pills weeks ago  for which he denies SI.   Patient presenting with a mix of neurologic and potentially psychiatric concerns today.  CT head without acute intracranial abnormality.  Labs without significant findings.  He denies any current chest pain, or SI.  For concern of abnormalities on my neurologic exam as well as history with new onset hallucinations, MRI brain with and without contrast ordered, MRI cervical spine, and MRI back given lower back pain with radiation to the right leg.  Consulted neurology, Dr. Dr. Amada Jupiter given history of possible seizure-like activity as well as other neurologic abnormalities.  Do feel if his MRIs are negative, and no acute neurologic pathology per neurology that he will need psychiatric evaluation for possible schizophrenia, concern for inappropriate use of his medications a few weeks ago. Wife and patient in agreement with this plan.   Do not see signs of withdrawal or infection as etiology of symptoms. Care signed out to Dr. Lockie Mola with imaging pending.  He is not IVCd.      Final Clinical Impression(s) / ED Diagnoses Final diagnoses:  Memory deficit  Auditory hallucinations  Right sided numbness  Nonintractable headache, unspecified chronicity pattern, unspecified headache type    Rx / DC Orders ED Discharge Orders     None        Alvira Monday, MD 08/23/21 1559

## 2021-08-23 NOTE — ED Notes (Signed)
Consent signed for lumbar puncture.

## 2021-08-23 NOTE — ED Provider Notes (Signed)
Emergency Medicine Provider Triage Evaluation Note  Lawrence George , a 35 y.o. male  was evaluated in triage.  Pt complains of AMS s/p fall that occurred 1 week ago. Pt is unsure how he fell but states he hit the right side of his head. Has been forgetful since then. He apparently left his house and was found by his friends at [place] but did not recognize them. PT has been hearing voices as well. Family member denies psychiatric history. Does mention he took a bottle of "Sleeping pills" 2 weeks ago and EMS came to evaluate patient at that time.   Review of Systems  Positive: + headache, memory loss, hallucinations Negative: - speech changes, unilateral weakness or numbness  Physical Exam  BP 113/72 (BP Location: Right Arm)   Pulse 78   Temp 98.6 F (37 C) (Oral)   Resp 15   SpO2 98%  Gen:   Awake, no distress   Resp:  Normal effort  MSK:   Moves extremities without difficulty  Other:  CN intact. Strength 5/5 to BUE and BLEs. Sensation intact. Normal finger to nose.   Medical Decision Making  Medically screening exam initiated at 12:07 PM.  Appropriate orders placed.  Pedro Whiters was informed that the remainder of the evaluation will be completed by another provider, this initial triage assessment does not replace that evaluation, and the importance of remaining in the ED until their evaluation is complete.     Tanda Rockers, PA-C 08/23/21 1209    Tegeler, Canary Brim, MD 08/23/21 [phone]

## 2021-08-23 NOTE — Consult Note (Signed)
Neurology Consultation  Reason for Consult: Confusion, altered mental status Referring Physician: Dr. Lockie Mola  CC: Confusion, altered mental status  History is obtained from: Chart review, Patient's wife, Patient   HPI: Lawrence George is a 35 y.o. male with a medical history significant for coronary artery disease who follows with [facility] in [facility] who presented to the ED 9/28 for evaluation of confusion, auditory hallucinations, and an intermittent right sided headache with photophobia and phonophobia. Patient's wife states that approximately 5 months ago, patient has had "heart attacks" at home where his right arm begins to shake and patient complains of feeling nervous before clutching his chest with rapid respirations with vocalizations, his body going weak, and then begins to shake his whole body with urinary and sometimes stool incontinence for approximately one minute. Afterwards, he is confused and lethargic. She states that this has happened 6 times in the last 5 months. She states that approximately one month ago, patient was followed by cardiology being treated for his CAD when he was required to take a medication to keep him from sleeping more than 2 hours per day so that "his heart would not go into a coma state". She states that he became frustrated that he could not sleep or work and he took 2 boxes of sleeping pills about 15 days ago and she believes that this was a suicide attempt. His son found him cold and unresponsive and activated EMS. She states that he was seen at that time in the ED, detoxed, and discharged home. Since this event, she noticed that he has been progressively confused.  She states that she has noticed that patient had been seeing and hearing things that are not there when he would pull up camera footage from their security system and states that there were people in front of the house when there were no people on the video. They have rooms  in the upstairs of their house that are not used by the patient was convinced that there were people staying in those rooms and called the police multiple times insisting there were people there. Then last week patient had an episode where his right arm began to shake and he clutched his chest and he fell hitting his right head on bedroom furniture followed by convulsions. Since this fall she states that his mental status has progressively worsened. He has left the house and gotten lost and family has been unable to find him and is at times unable to identify family members and friends. He got rid of his cell phone and other personal items.  Patient also endorses hearing voices of family members including his wife and children telling him that they love him. He states that he wants the voices to go away but the voices tell them that they cannot because they are part of his subconscious and may help him remember his former life. He states that he only hears voices when he is alone and at home.  He also endorses right head pain with photophobia and phonophobia and states that he sees bright lights at night but only when he is in his room or at his house. Patient endorses that he quit smoking cigarettes 3 days ago but does not remember if he has been drinking. Patient's wife states that whenever he believes that people are in the room, he locks himself in his bedroom, dances, sings in 2 days ago he carved into his left wrist but cannot remember what he used to  carve. He denies any history of traumatic brain injury, childhood seizures, or significant illness.  ROS: A complete ROS was performed and is negative except as noted in the HPI.   Past Medical History:  Diagnosis Date   Coronary artery disease    History reviewed. No pertinent family history.  Social History:   reports that he has never smoked. He has never used smokeless tobacco. He reports that he does not currently use alcohol. He reports that he  does not use drugs. Smokes cigarettes, reports that he quit smoking 3 days ago.   Medications No current facility-administered medications for this encounter. No current outpatient medications on file.  Exam: Current vital signs: BP 115/67   Pulse 73   Temp 98.6 F (37 C) (Oral)   Resp 19   SpO2 100%  Vital signs in last 24 hours: Temp:  [98.6 F (37 C)] 98.6 F (37 C) (09/28 1134) Pulse Rate:  [67-84] 73 (09/28 1615) Resp:  [15-20] 19 (09/28 1615) BP: (102-118)/(66-79) 115/67 (09/28 1615) SpO2:  [97 %-100 %] 100 % (09/28 1615)  GENERAL: Awake, alert, in no acute distress Psych: Affect appropriate for situation, does not appear to be attending to internal stimulation. Patient is calm and cooperative with examination.  Head: Normocephalic and atraumatic, without obvious abnormality EENT: Normal conjunctivae, dry mucous membranes, no OP obstruction LUNGS: Normal respiratory effort. Non-labored breathing on room air, SpO2 98% on cardiac monitor CV: Regular rate and rhythm on telemetry ABDOMEN: Soft, non-tender, non-distended Extremities: warm, well perfused, without obvious deformity, left wrist with carving.   NEURO:  Mental Status: Awake, alert, and oriented to person, place, and situation.  He is able to provide some details regarding history of present illness but his wife provides a more accurate timeline and description.  Speech/Language: speech is intact without dysarthria. Naming, fluency, and comprehension intact without aphasia. No neglect is noted. Cranial Nerves:  II: PERRL 3 mm/brisk. Visual fields full.  III, IV, VI: EOMI without gaze preference or ptosis. V: Sensation to face is decreased on to light touch on the right.  VII: Face is symmetric resting and smiling.  VIII: Hearing is intact to voice IX, X: Palate elevation is symmetric. Phonation normal.  XI: Normal sternocleidomastoid and trapezius muscle strength XII: Tongue protrudes midline without  fasciculations.   Motor: 5/5 strength is all muscle groups without pronator drift.   Tone is normal. Bulk is normal.  Sensation: Decreased sensation to light touch on the right upper and lower extremities.  Coordination: FTN intact bilaterally. HKS intact bilaterally. No pronator drift. DTRs: 3+ right patellae, 2+ left patellae, 2+ and symmetric biceps and brachioradialis.  Gait: Deferred  Labs I have reviewed labs in epic and the results pertinent to this consultation are: CBC    Component Value Date/Time   WBC 7.7 08/23/2021 1206   RBC 4.71 08/23/2021 1206   HGB 14.3 08/23/2021 1206   HCT 43.1 08/23/2021 1206   PLT 348 08/23/2021 1206   MCV 91.5 08/23/2021 1206   MCH 30.4 08/23/2021 1206   MCHC 33.2 08/23/2021 1206   RDW 11.7 08/23/2021 1206   LYMPHSABS 1.6 08/23/2021 1206   MONOABS 0.6 08/23/2021 1206   EOSABS 0.0 08/23/2021 1206   BASOSABS 0.0 08/23/2021 1206   CMP     Component Value Date/Time   NA 138 08/23/2021 1206   K 3.8 08/23/2021 1206   CL 105 08/23/2021 1206   CO2 26 08/23/2021 1206   GLUCOSE 77 08/23/2021 1206   BUN  9 08/23/2021 1206   CREATININE 0.85 08/23/2021 1206   CALCIUM 8.7 (L) 08/23/2021 1206   PROT 6.8 08/23/2021 1206   ALBUMIN 3.9 08/23/2021 1206   AST 19 08/23/2021 1206   ALT 18 08/23/2021 1206   ALKPHOS 63 08/23/2021 1206   BILITOT 0.8 08/23/2021 1206   GFRNONAA >60 08/23/2021 1206   Lipid Panel  No results found for: CHOL, TRIG, HDL, CHOLHDL, VLDL, LDLCALC, LDLDIRECT  Imaging I have reviewed the images obtained:  MRI examination of the brain wwo contrast: Cerbellum extends 4mm below foramen magnum, 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  Assessment: 35 year old male who presents with 6 episodes in the past 5 months of right arm twitching followed by chest clutching with rapid respirations and generalized body shaking with subsequent confusion and lethargy.  His wife reported potential overdose of sleeping medications 15 days ago  with confusion, visual and auditory hallucinations, and paranoia since. Wife describes an episode of loss of consciousness with full body shaking where patient hit his right head approximately 1 week ago and worsening of confusion following. - Examination reveals patient with right-sided decreased sensation and some confusion. Denies current AV hallucinations.  - Presentation concerning for seizure activity and psychosis with or without mania. We are unable to obtain outside records to verify patient on medication to keep him from sleeping at this time. At this time, it is unclear if the psychosis and seizures are related. Pending MRI brain with and without contrast for further evaluation.  - If MRI and EEG are negative, etiology of presentation would most likely follow that his seizure activity and psychosis that are not related. Patient will need LP for acute onset psychosis with seizures without known psychiatric or seizure history.   Impression: Acute psychosis Concern for seizures  Recommendations: - MRI brain wwo contrast - Routine EEG after MRI - Depakote 1,500 mg PO loading dose followed by maintenance dosing of 500 mg PO BID - Valproic acid level tomorrow AM - Will need LP with: cell counts on tubes 1 and 4 with differential, protein, glucose, gram stain, culture, autoimmune encephalitis panel on serum and CSF. CSF Lyme, VDRL. - Serum HIV - Neurology will continue to follow   Pt seen by NP/Neuro and later by MD. Note/plan to be edited by MD as needed.  Lanae Boast, AGAC-NP [facility] Pager: [phone]   NEUROHOSPITALIST ADDENDUM Performed a face to face diagnostic evaluation.   I have reviewed the contents of history and physical exam as documented by PA/ARNP/Resident and agree with above documentation.  I have discussed and formulated the above plan as documented. Edits to the note have been made as needed.  Impression/Key exam findings/Plan: Limited  history due to language barrier and having some memory issues after seizure like episode and fall a week ago where he hit the side of his bed. Essentially has had 5-6 episode over the last 3-4 months of RUE shaking, progresses to loss of consciousness with loss of bladder and bowel, confused afterwards. Also been hearing voices for the last 2 days and seeing things for 3-4 months. Overdosed on sleeping meds about 15 days ago. Patient himself cant give a good history due to memory issues since fall a week ago. Maybe has CAD and is being worked up outpatient by cardiology.  Difficult to put his symptoms together but in addition to concern for an underlying primary psychiatric disorder, I am also concerned about potential seizures and possible autoimmune encephalitis. Will get workup with rEEG, LP  with CSF cell count, differential, protein, glucose and autoimmune encephalitis panel, HIV, VDRL, Lyme. Depakote seems to be a good option for him and agree with continuing for now.  I also reviewed his MRI Brain, C, L spine w + w/o contrast which is notable for 71mm downward extension of the cerebellar tonsils from the foramen magnum and consistent with mild chiari 1 malformation but his headache is not consistent with a occipital cough headache, usually seen with chiari malformation. No syrinx in the C spine but will add MRI T spine to evaluate for this specially given his radiculopathic pain.  Erick Blinks, MD [facility] [phone]   If 7pm to 7am, please call on call as listed on AMION.

## 2021-08-23 NOTE — ED Notes (Addendum)
Transport here to take pt to MRI, neuro PA at bedside. Will MRI attempt later.

## 2021-08-23 NOTE — H&P (Addendum)
History and Physical    Pranish Akhavan ZOX:[id number] DOB: 20-Sep-1986 DOA: 08/23/2021  PCP: No primary care provider on file. Patient coming from: Home  Chief Complaint: Altered mental status  HPI: Kermitt Harjo is a 35 y.o. Spanish-speaking male with medical history significant of CAD not on medications presented to the ED for evaluation of confusion, auditory hallucinations, intermittent right-sided headache with photophobia and phonophobia.  Wife concerned about possible suicide attempt as patient overdosed on sleeping pills 2 weeks ago.  Also reported episodes of seizure-like activity for the past several months and a fall a week ago.  Reported auditory hallucinations for the past 2 days and visual hallucinations for 3 to 4 months.  In the ED, patient was hemodynamically stable.  No significant derangements on CBC and CMP.  Blood ethanol, salicylate, and acetaminophen level undetectable.  UDS negative.  Ammonia level normal.  COVID and influenza PCR negative.    CT head negative for acute intracranial abnormality.  Showing small right frontal scalp contusion without acute fracture.    MRI brain negative for acute infarct.  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    MRI of C-spine showing mild spinal canal stenosis at C3-C4, C4-C5, C5-C6, and C6-C7.  Disc flattens the ventral cord at C4-C5 and C5-C6.  Moderate right and mild left foraminal stenosis at C3-C4.  Mild bilateral foraminal stenosis at C6-C7.  MRI of lumbar spine showing partially lumbarized S1 segment, moderate canal stenosis at L3-L4 and mild canal stenosis at L4-L5.  Also showing mild bilateral foraminal stenosis at L2-L3, L3-L4, and L4-L5.  Neurology consulted  -concerned about underlying primary psychiatric disorder and also potential seizures and possible autoimmune encephalitis.  Recommended Depakote  1500 mg p.o. loading dose followed by maintenance dose of 500 mg p.o. twice daily.  Recommended additional work-up including EEG, LP, and labs.  LP attempted in the ED and was unsuccessful.  Neurology did not feel that his headaches were related to mild Chiari I malformation seen on MRI.  Recommended also obtaining MRI of thoracic spine given radiculopathic pain.  Depakote loading dose given in the ED.  Spanish interpreter services used, however, patient is confused and not able to give a meaningful history.  No family at bedside.  I was informed by the interpreter that the information conveyed to her in Spanish by the patient is not making sense.  States he is having headaches since he had a head injury due to his heart medications.  He stopped taking his medications.  Reports taking medication for "circulation" and "brain vitamin."  He thinks he was taking "aspirin 81 mg" and "aspirin 325 mg."  He thinks his cardiologist is in [place].  When asked about his head injury, patient initially stated he does not remember anything.  Later stated he injured his head as he was having "heart attacks."  Reported seeing things which other people do not see and also hearing voices for the past 2 days.  Reports taking 2 bottles of sleeping pills to help him sleep.  No additional history could be obtained from him.  Review of Systems:  All systems reviewed and apart from history of presenting illness, are negative.  Past Medical History:  Diagnosis Date   Coronary artery disease     History reviewed. No pertinent surgical history.   reports that he has never smoked. He has never used smokeless tobacco. He reports that he does  not currently use alcohol. He reports that he does not use drugs.  No Known Allergies  History reviewed. No pertinent family history.  Prior to Admission medications   Not on File    Physical Exam: Vitals:   08/23/21 2030 08/23/21 2100 08/23/21 2115 08/23/21 2249  BP:  118/85 115/84 110/82 (!) 115/94  Pulse: 69 65 63 (!) 102  Resp: 18 15 18  (!) 21  Temp:      TempSrc:      SpO2: 100% 100% 100% 100%    Physical Exam Constitutional:      General: He is not in acute distress. HENT:     Head: Normocephalic and atraumatic.  Eyes:     Extraocular Movements: Extraocular movements intact.     Conjunctiva/sclera: Conjunctivae normal.  Cardiovascular:     Rate and Rhythm: Normal rate and regular rhythm.     Pulses: Normal pulses.  Pulmonary:     Effort: Pulmonary effort is normal. No respiratory distress.     Breath sounds: Normal breath sounds. No wheezing or rales.  Abdominal:     General: Bowel sounds are normal. There is no distension.     Palpations: Abdomen is soft.     Tenderness: There is no abdominal tenderness.  Musculoskeletal:        General: No swelling or tenderness.     Cervical back: Normal range of motion and neck supple.  Skin:    General: Skin is warm and dry.  Neurological:     General: No focal deficit present.     Mental Status: He is alert and oriented to person, place, and time.     Labs on Admission: I have personally reviewed following labs and imaging studies  CBC: Recent Labs  Lab 08/23/21 1206  WBC 7.7  NEUTROABS 5.4  HGB 14.3  HCT 43.1  MCV 91.5  PLT 348   Basic Metabolic Panel: Recent Labs  Lab 08/23/21 1206  NA 138  K 3.8  CL 105  CO2 26  GLUCOSE 77  BUN 9  CREATININE 0.85  CALCIUM 8.7*   GFR: CrCl cannot be calculated (Unknown ideal weight.). Liver Function Tests: Recent Labs  Lab 08/23/21 1206  AST 19  ALT 18  ALKPHOS 63  BILITOT 0.8  PROT 6.8  ALBUMIN 3.9   No results for input(s): LIPASE, AMYLASE in the last 168 hours. Recent Labs  Lab 08/23/21 1207  AMMONIA 29   Coagulation Profile: No results for input(s): INR, PROTIME in the last 168 hours. Cardiac Enzymes: No results for input(s): CKTOTAL, CKMB, CKMBINDEX, TROPONINI in the last 168 hours. BNP (last 3 results) No  results for input(s): PROBNP in the last 8760 hours. HbA1C: No results for input(s): HGBA1C in the last 72 hours. CBG: No results for input(s): GLUCAP in the last 168 hours. Lipid Profile: No results for input(s): CHOL, HDL, LDLCALC, TRIG, CHOLHDL, LDLDIRECT in the last 72 hours. Thyroid Function Tests: No results for input(s): TSH, T4TOTAL, FREET4, T3FREE, THYROIDAB in the last 72 hours. Anemia Panel: No results for input(s): VITAMINB12, FOLATE, FERRITIN, TIBC, IRON, RETICCTPCT in the last 72 hours. Urine analysis: No results found for: COLORURINE, APPEARANCEUR, LABSPEC, PHURINE, GLUCOSEU, HGBUR, BILIRUBINUR, KETONESUR, PROTEINUR, UROBILINOGEN, NITRITE, LEUKOCYTESUR  Radiological Exams on Admission: CT Head Wo Contrast  Result Date: 08/23/2021 CLINICAL DATA:  AMS s/p fall EXAM: CT HEAD WITHOUT CONTRAST TECHNIQUE: Contiguous axial images were obtained from the base of the skull through the vertex without intravenous contrast. COMPARISON:  None. FINDINGS: Brain: No  evidence of acute infarction, hemorrhage, hydrocephalus, extra-axial collection or mass lesion/mass effect. Vascular: No hyperdense vessel identified. Skull: Small right frontal scalp contusion without acute fracture. Sinuses/Orbits: Clear visualized sinuses.  Unremarkable orbits. Other: No mastoid effusions. IMPRESSION: 1. No evidence of acute intracranial abnormality. 2. Small right frontal scalp contusion without acute fracture. Electronically Signed   By: Feliberto Harts M.D.   On: 08/23/2021 13:33   MR Brain W and Wo Contrast  Result Date: 08/23/2021 CLINICAL DATA:  Neuro deficit, acute, stroke suspected EXAM: MRI HEAD WITHOUT AND WITH CONTRAST TECHNIQUE: Multiplanar, multiecho pulse sequences of the brain and surrounding structures were obtained without and with intravenous contrast. CONTRAST:  53mL GADAVIST GADOBUTROL 1 MMOL/ML IV SOLN COMPARISON:  CT Head from the same day FINDINGS: Brain: No acute infarction, hemorrhage,  hydrocephalus, extra-axial collection or mass lesion. Mild frontal predominant T2 hyperintensities in the white matter. The cerebellar tonsils extend up to 6 mm below the foramen magnum with mildly pointed morphology on the right and mild crowding of the posterior fossa. No abnormal enhancement. Vascular: Major arterial flow voids are maintained skull base. Skull and upper cervical spine: Normal marrow signal. Sinuses/Orbits: Mild paranasal sinus mucosal thickening. Other: Trace mastoid effusions. IMPRESSION: 1. No acute infarct. 2. The cerebellar tonsils extend up to 6 mm below the foramen magnum with mildly pointed morphology on the right and mild crowding of the posterior fossa. 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 3. Mild frontal predominant T2 hyperintensities in the white matter. This finding is nonspecific but can be seen in the setting of chronic microvascular ischemia, a demyelinating process such as multiple sclerosis, or chronic migraines. Electronically Signed   By: Feliberto Harts M.D.   On: 08/23/2021 18:56   MR Cervical Spine Wo Contrast  Result Date: 08/23/2021 EXAM: MRI CERVICAL SPINE WITHOUT CONTRAST TECHNIQUE: Multiplanar, multisequence MR imaging of the cervical spine was performed. No intravenous contrast was administered. COMPARISON:  None. FINDINGS: Alignment: Straightening of the normal cervical lordosis. No substantial sagittal subluxation. Vertebrae: Vertebral body heights are maintained. No focal marrow edema to suggest acute fracture or discitis/osteomyelitis. Cord: Normal cord signal. Posterior Fossa, vertebral arteries, paraspinal tissues: Please see concurrent MRI brain for evaluation of posterior fossa and description of inferior cerebellar tonsillar descent. Visualized vertebral artery flow voids are maintained. Disc levels: C2-C3: No significant disc protrusion, foraminal stenosis, or canal stenosis. C3-C4: Small right eccentric posterior disc osteophyte  complex and right greater than left facet and uncovertebral hypertrophy. Resulting moderate right and mild left foraminal stenosis. Mild canal stenosis. C4-C5: Small central disc protrusion which contacts and flattens the ventral cord. Mild canal stenosis without significant foraminal stenosis. C5-C6: Small central disc protrusion which contacts and flattens the ventral cord. Right greater than left uncovertebral hypertrophy. Mild canal stenosis without significant foraminal stenosis. C6-C7: Posterior disc osteophyte complex and mild bilateral uncovertebral hypertrophy. Mild bilateral foraminal stenosis and mild canal stenosis. C7-T1: No significant disc protrusion, foraminal stenosis, or canal stenosis. IMPRESSION: 1. Mild canal stenosis at C3-C4, C4-C5, C5-C6, and C6-C7. Disc flattens the ventral cord at C4-C5 and C5-C6. 2. Moderate right and mild left foraminal stenosis at C3-C4. 3. Mild bilateral foraminal stenosis at C6-C7. 4. Please see concurrent MRI brain for description of inferior cerebellar tonsillar descent. Electronically Signed   By: Feliberto Harts M.D.   On: 08/23/2021 19:02   MR LUMBAR SPINE WO CONTRAST  Result Date: 08/23/2021 CLINICAL DATA:  Low back pain, progressive neurologic deficit EXAM: MRI LUMBAR SPINE WITHOUT CONTRAST TECHNIQUE:  Multiplanar, multisequence MR imaging of the lumbar spine was performed. No intravenous contrast was administered. COMPARISON:  None. FINDINGS: Segmentation: Transitional lumbosacral anatomy. For the purposes of this dictation, there is partially lumbarized S1 segment. Alignment: Straightening of the normal lumbar lordosis. No substantial sagittal subluxation. Vertebrae: Vertebral body heights are maintained. No focal marrow edema to suggest acute fracture discitis/osteomyelitis. No suspicious bone lesions. Conus medullaris and cauda equina: Conus extends to the L1-L2 level. Conus appears normal. Paraspinal and other soft tissues: Unremarkable. Disc levels:  T12-L1: No significant disc protrusion, foraminal stenosis, or canal stenosis. L1-L2: No significant disc protrusion, foraminal stenosis, or canal stenosis. L2-L3: Slight disc bulging and mild facet arthropathy. No significant canal stenosis. Mild bilateral foraminal stenosis. L3-L4: Disc desiccation height loss. Broad disc bulge and mild bilateral facet hypertrophy. Resulting moderate canal stenosis and mild bilateral foraminal stenosis. L4-L5: Mild disc height loss and desiccation. Broad disc bulge and mild bilateral facet arthropathy. Resulting mild canal stenosis and mild bilateral foraminal stenosis. L5-S1: No significant disc protrusion, foraminal stenosis, or canal stenosis. IMPRESSION: 1. Transitional lumbosacral anatomy. For the purposes of this dictation, there is partially lumbarized S1 segment. Correlation with radiographs is recommended prior to any operative intervention. 2. Moderate canal stenosis at L3-L4.  Mild canal stenosis at L4-L5. 3. Mild bilateral foraminal stenosis at L2-L3, L3-L4, and L4-L5. Electronically Signed   By: Feliberto Harts M.D.   On: 08/23/2021 19:11    EKG: Independently reviewed.  Sinus rhythm with shortened PR interval.  Mild diffuse ST elevations, probable repolarization abnormality.  No prior tracing for comparison.  Assessment/Plan Principal Problem:   Psychosis [facility]) Active Problems:   Suicide attempt [facility])   Spinal stenosis   CAD (coronary artery disease)   Acute psychosis Concern for seizures No metabolic derangements on labs. Blood ethanol, salicylate, and acetaminophen level undetectable.  UDS negative.  Ammonia level normal. CT head negative for acute intracranial abnormality. 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  Neurology concerned about underlying primary psychiatric disorder and also potential seizures and possible autoimmune  encephalitis. Neurology did not feel that his headaches were related to mild Chiari I malformation seen on MRI.  Recommended also obtaining MRI of thoracic spine given radiculopathic pain. -Neurology consulted, appreciate recommendations -Depakote 1500 mg p.o. loading dose administered in the ED.  Continue maintenance dose of 500 mg p.o. twice daily. -Routine EEG -LP with CSF cell count, differential, gram stain, culture, glucose, protein, autoimmune encephalitis panel on serum and CSF, CSF Lyme, VDRL, serum HIV, RPR, vitamin B1 level, vitamin B12 level, folate level.   -Valproic acid level in a.m. -LP attempted by ED physician and unsuccessful.  Will need LP by IR in the morning. -MRI thoracic spine  Concern for suicide attempt -Psych consulted -Suicide precautions  Cervical and lumbar spinal canal stenosis, foraminal stenosis MRI of C-spine showing mild spinal canal stenosis at C3-C4, C4-C5, C5-C6, and C6-C7.  Disc flattens the ventral cord at C4-C5 and C5-C6.  Moderate right and mild left foraminal stenosis at C3-C4.  Mild bilateral foraminal stenosis at C6-C7. MRI of lumbar spine showing partially lumbarized S1 segment, moderate canal stenosis at L3-L4 and mild canal stenosis at L4-L5.  Also showing mild bilateral foraminal stenosis at L2-L3, L3-L4, and L4-L5. -Will need neurosurgery follow-up.  CAD Not on any medications at present and not able to find any prior records.  EKG showing mild diffuse ST elevations, probable repolarization  abnormality.  No prior tracing for comparison.  Patient is not endorsing any anginal symptoms at present. -Troponin  DVT prophylaxis: SCDs Code Status: Full code Family Communication: No family available at this time. Disposition Plan: Status is: Inpatient  Remains inpatient appropriate because:Altered mental status and Inpatient level of care appropriate due to severity of illness  Dispo: The patient is from: Home              Anticipated d/c is to:   Home versus psychiatric facility              Patient currently is not medically stable to d/c.   Difficult to place patient No  Level of care: Telemetry Medical  The medical decision making on this patient was of high complexity and the patient is at high risk for clinical deterioration, therefore this is a level 3 visit.  John Giovanni MD [facility]  If 7PM-7AM, please contact night-coverage [email or web address]  08/24/2021, 12:42 AM

## 2021-08-24 ENCOUNTER — Inpatient Hospital Stay (HOSPITAL_COMMUNITY): Payer: Self-pay

## 2021-08-24 DIAGNOSIS — R45851 Suicidal ideations: Secondary | ICD-10-CM

## 2021-08-24 DIAGNOSIS — F23 Brief psychotic disorder: Secondary | ICD-10-CM | POA: Diagnosis present

## 2021-08-24 DIAGNOSIS — M4800 Spinal stenosis, site unspecified: Secondary | ICD-10-CM

## 2021-08-24 DIAGNOSIS — T1491XA Suicide attempt, initial encounter: Secondary | ICD-10-CM

## 2021-08-24 DIAGNOSIS — I2510 Atherosclerotic heart disease of native coronary artery without angina pectoris: Secondary | ICD-10-CM

## 2021-08-24 DIAGNOSIS — R4182 Altered mental status, unspecified: Secondary | ICD-10-CM

## 2021-08-24 LAB — CSF CELL COUNT WITH DIFFERENTIAL
RBC Count, CSF: 34 /mm3 — ABNORMAL HIGH
Tube #: 3
WBC, CSF: 2 /mm3 (ref 0–5)

## 2021-08-24 LAB — VITAMIN B12: Vitamin B-12: 231 pg/mL (ref 180–914)

## 2021-08-24 LAB — PROTEIN, CSF: Total  Protein, CSF: 35 mg/dL (ref 15–45)

## 2021-08-24 LAB — FOLATE: Folate: 13.9 ng/mL (ref >5.9)

## 2021-08-24 LAB — TROPONIN I (HIGH SENSITIVITY): Troponin I (High Sensitivity): 4 ng/L (ref <18)

## 2021-08-24 LAB — GLUCOSE, CSF: Glucose, CSF: 67 mg/dL (ref 40–70)

## 2021-08-24 LAB — VALPROIC ACID LEVEL: Valproic Acid Lvl: 69 ug/mL (ref 50.0–100.0)

## 2021-08-24 LAB — RPR: RPR Ser Ql: NONREACTIVE

## 2021-08-24 IMAGING — RF DG SPINAL PUNCT LUMBAR DIAG WITH FL CT GUIDANCE
2 series · 2 of 2 positions shown · non-contrast
Comparison: MRI lumbar spine [DATE]

CLINICAL DATA: Encephalopathy.

EXAM:
DIAGNOSTIC LUMBAR PUNCTURE UNDER FLUOROSCOPIC GUIDANCE

[Series 1: fluoro_iodine 2fps_bw · 0.17mm/px · 1 of 1 slices shown]
[im 1/1]
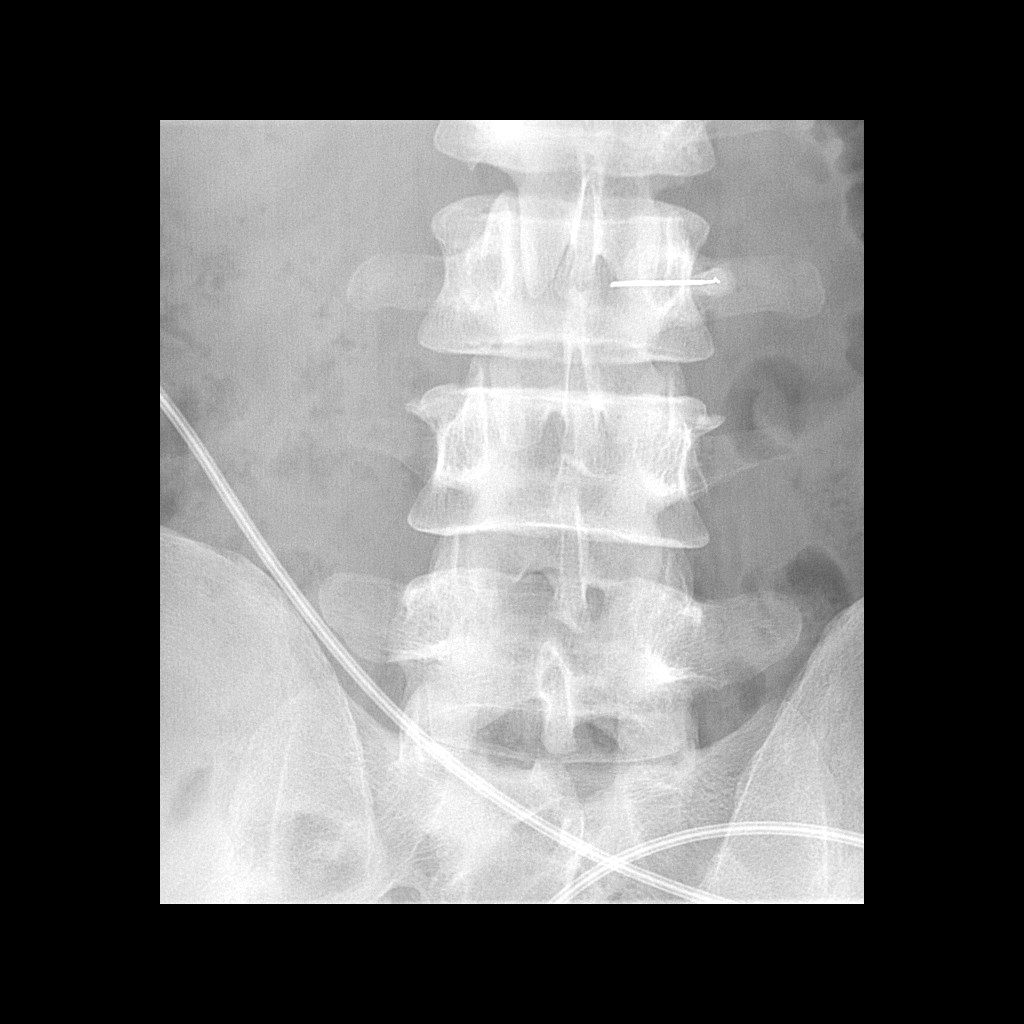

[Series 2: cp_standard · 0.17mm/px · 1 of 1 slices shown]
[im 1/1]
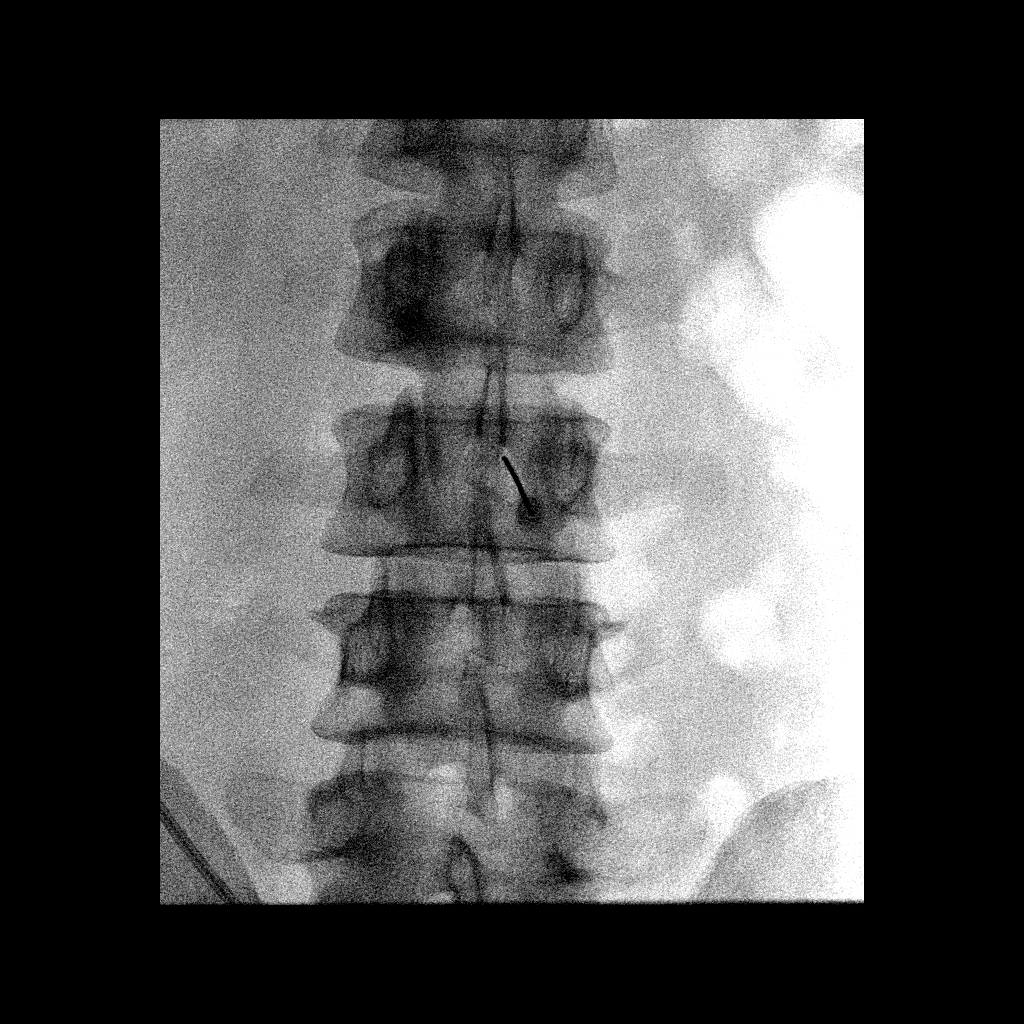

[2 of 2 positions shown; findings below may reference images not displayed]

FLUOROSCOPY TIME:  Fluoroscopy Time:  1 minute 0 second

Radiation Exposure Index (if provided by the fluoroscopic device):

Number of Acquired Spot Images: 2

PROCEDURE:
Informed consent was obtained from the patient prior to the
procedure, including potential complications of headache, allergy,
and pain. With the patient prone, the lower back was prepped with
Betadine. 1% Lidocaine was used for local anesthesia. Lumbar
puncture was performed at the L2-3 level using a 20 gauge needle
with return of clear CSF with an opening pressure of 13 cm water.
Ten ml of CSF were obtained for laboratory studies. The patient
tolerated the procedure well and there were no apparent
complications.
IMPRESSION: Successful lumbar puncture using fluoroscopy.

## 2021-08-24 MED ORDER — LORAZEPAM 2 MG/ML IJ SOLN: 1.0000 mg | INTRAMUSCULAR | Status: DC | PRN

## 2021-08-24 MED ORDER — RISPERIDONE 1 MG PO TABS
1.0000 mg | ORAL_TABLET | Freq: Every day | ORAL | Status: DC
  Administered 2021-08-24: 1 mg via ORAL
  Filled 2021-08-24 (×2): qty 1

## 2021-08-24 MED ORDER — IPRATROPIUM-ALBUTEROL 0.5-2.5 (3) MG/3ML IN SOLN: 3.0000 mL | RESPIRATORY_TRACT | Status: DC | PRN

## 2021-08-24 MED ORDER — SODIUM CHLORIDE 0.9 % IV SOLN: INTRAVENOUS | Status: AC

## 2021-08-24 MED ORDER — OXYCODONE HCL 5 MG PO TABS: 5.0000 mg | ORAL_TABLET | ORAL | Status: DC | PRN

## 2021-08-24 MED ORDER — THIAMINE HCL 100 MG PO TABS
100.0000 mg | ORAL_TABLET | Freq: Every day | ORAL | Status: DC
  Administered 2021-08-24 – 2021-08-25 (×2): 100 mg via ORAL
  Filled 2021-08-24 (×2): qty 1

## 2021-08-24 MED ORDER — ACETAMINOPHEN 325 MG PO TABS: 650.0000 mg | ORAL_TABLET | Freq: Four times a day (QID) | ORAL | Status: DC | PRN

## 2021-08-24 MED ORDER — TRAZODONE HCL 50 MG PO TABS: 50.0000 mg | ORAL_TABLET | Freq: Every evening | ORAL | Status: DC | PRN

## 2021-08-24 MED ORDER — LIDOCAINE HCL (PF) 1 % IJ SOLN: 5.0000 mL | Freq: Once | INTRAMUSCULAR | Status: AC

## 2021-08-24 MED ORDER — LORAZEPAM 1 MG PO TABS: 1.0000 mg | ORAL_TABLET | ORAL | Status: DC | PRN

## 2021-08-24 MED ORDER — LORAZEPAM 2 MG/ML IJ SOLN: 2.0000 mg | Freq: Once | INTRAMUSCULAR | Status: DC

## 2021-08-24 MED ORDER — FOLIC ACID 1 MG PO TABS
1.0000 mg | ORAL_TABLET | Freq: Every day | ORAL | Status: DC
  Administered 2021-08-24 – 2021-08-25 (×2): 1 mg via ORAL
  Filled 2021-08-24 (×2): qty 1

## 2021-08-24 MED ORDER — ADULT MULTIVITAMIN W/MINERALS CH
1.0000 | ORAL_TABLET | Freq: Every day | ORAL | Status: DC
  Administered 2021-08-24 – 2021-08-25 (×2): 1 via ORAL
  Filled 2021-08-24 (×2): qty 1

## 2021-08-24 MED ORDER — HYDRALAZINE HCL 20 MG/ML IJ SOLN: 10.0000 mg | INTRAMUSCULAR | Status: DC | PRN

## 2021-08-24 MED ORDER — METOPROLOL TARTRATE 5 MG/5ML IV SOLN: 5.0000 mg | INTRAVENOUS | Status: DC | PRN

## 2021-08-24 MED ORDER — THIAMINE HCL 100 MG/ML IJ SOLN
100.0000 mg | Freq: Every day | INTRAMUSCULAR | Status: DC
  Filled 2021-08-24: qty 2

## 2021-08-24 MED ORDER — PANTOPRAZOLE SODIUM 40 MG PO TBEC
40.0000 mg | DELAYED_RELEASE_TABLET | Freq: Every day | ORAL | Status: DC
  Administered 2021-08-24 – 2021-08-25 (×2): 40 mg via ORAL
  Filled 2021-08-24 (×2): qty 1

## 2021-08-24 MED ORDER — SENNOSIDES-DOCUSATE SODIUM 8.6-50 MG PO TABS: 1.0000 | ORAL_TABLET | Freq: Every evening | ORAL | Status: DC | PRN

## 2021-08-24 MED ORDER — ACETAMINOPHEN 650 MG RE SUPP: 650.0000 mg | Freq: Four times a day (QID) | RECTAL | Status: DC | PRN

## 2021-08-24 NOTE — ED Notes (Signed)
Sitting up eating lunch at this time

## 2021-08-24 NOTE — ED Notes (Signed)
Family updated as to patient's status.

## 2021-08-24 NOTE — Consult Note (Signed)
Reason for Consult: Acute Psychosis, Suicide Attempt Referring Physician: John Giovanni, MD  Lawrence George is an 35 y.o. male.  HPI:  Lawrence George is a 35 y.o. male admitted medically for 08/23/2021 11:29 AM for Seizure-like activity and acute psychosis. He carries the no psychiatric diagnoses and has a past medical history of CAD not on medications.Psychiatry was consulted for Acute Psychosis and a Suicide Attempt.   He does not meet criteria for inpatient psych admission.  He was not on any psychiatric medications prior to admission. On initial examination, patient laying in bed but pleasant and answer all questions.    On interview patient was lying down in the stretcher in the ED and his wife was present.  As both of them are Spanish-speaking and interpreter was used for the duration of the interview.  They report that patient began to have seizure-like activities in July of this year.  And that this was before any of the hallucinations started.  It was during this initial work-up that his cardiac issues were discovered.  He reports that a blockage was found in one of his vessels and so he was started on aspirin, some medication that dissolves under the tongue that neither of them can remember the name of (most likely Nitro), another medicine that they cannot remember the name of, and a stool softener.  When asked where he got his treatment for his cardiac issues he reports that he was seen in [place] at [facility].  However, he did not have ID and they would not see him without it so he used his cousins ID Lawrence George) and so am unable to view those records.  His wife reports that he started hearing things approximately 2 months ago.  At first it started out as indistinguishable whispers that he could not really make out.  However, as time went on he states that it turned into voices that sometimes would encourage him to stop smoking and other times tell him  to continue smoking.  He reports that he did have some depression during this time because of his heart condition he could not work/drive and had to stay at home all day.  During this time he did even mention that he said it would be better to let him die.  When asked about the reported taking of too many sleeping pills approximately 2 weeks ago he states that this was not a suicide attempt because he has too many things to live for like his wife and children.  They report that last week he fell and hit his head and after this was when he began to have visual hallucinations.  These consisted of seeing people trying to get into the house and also already in the house.  She reports that Monday this week patient locked himself in their room and when he finally came out he had carved her name into his left wrist.  When asked about this he reports that he does not remember anything about this period of time except that the boys had told him to write the name of the person he lives the most.  She states that after this she threw out all of the medications he had been taking and began giving him tea like valerian root.  He reports that he is had no hallucinations for the last 2 days.  He reports that currently he has no SI, HI, or AVH.  He reports that he used to smoke over 1 pack/day  but stopped when the voices told him to.  He reports no recent alcohol use and no illicit substance use.  He reports no previous psych history prior to the start of this current time period.  They report no known family history of psychiatric issues.  She stated that when he had been taking the medications for his heart he had been taking a product cardio aspirina and had been taking up to 10 pills of this a day sometimes.   Past Medical History:  Diagnosis Date   Coronary artery disease     History reviewed. No pertinent surgical history.  History reviewed. No pertinent family history.  Social History:  reports that he has  never smoked. He has never used smokeless tobacco. He reports that he does not currently use alcohol. He reports that he does not use drugs.  Allergies: No Known Allergies  Medications: I have reviewed the patient's current medications. Prior to Admission: (Not in a hospital admission)   Results for orders placed or performed during the hospital encounter of 08/23/21 (from the past 48 hour(s))  Comprehensive metabolic panel     Status: Abnormal   Collection Time: 08/23/21 12:06 PM  Result Value Ref Range   Sodium 138 135 - 145 mmol/L   Potassium 3.8 3.5 - 5.1 mmol/L   Chloride 105 98 - 111 mmol/L   CO2 26 22 - 32 mmol/L   Glucose, Bld 77 70 - 99 mg/dL    Comment: Glucose reference range applies only to samples taken after fasting for at least 8 hours.   BUN 9 6 - 20 mg/dL   Creatinine, Ser 1.61 0.61 - 1.24 mg/dL   Calcium 8.7 (L) 8.9 - 10.3 mg/dL   Total Protein 6.8 6.5 - 8.1 g/dL   Albumin 3.9 3.5 - 5.0 g/dL   AST 19 15 - 41 U/L   ALT 18 0 - 44 U/L   Alkaline Phosphatase 63 38 - 126 U/L   Total Bilirubin 0.8 0.3 - 1.2 mg/dL   GFR, Estimated >09 >60 mL/min    Comment: (NOTE) Calculated using the CKD-EPI Creatinine Equation (2021)    Anion gap 7 5 - 15    Comment: Performed at [facility], [address], [place], [place] [address]  Ethanol     Status: None   Collection Time: 08/23/21 12:06 PM  Result Value Ref Range   Alcohol, Ethyl (B) <10 <10 mg/dL    Comment: (NOTE) Lowest detectable limit for serum alcohol is 10 mg/dL.  For medical purposes only. Performed at [facility], [address] [place], [place] [address]   Urine rapid drug screen (hosp performed)     Status: None   Collection Time: 08/23/21 12:06 PM  Result Value Ref Range   Opiates NONE DETECTED NONE DETECTED   Cocaine NONE DETECTED NONE DETECTED   Benzodiazepines NONE DETECTED NONE DETECTED   Amphetamines NONE DETECTED NONE DETECTED   Tetrahydrocannabinol NONE DETECTED NONE DETECTED    Barbiturates NONE DETECTED NONE DETECTED    Comment: (NOTE) DRUG SCREEN FOR MEDICAL PURPOSES ONLY.  IF CONFIRMATION IS NEEDED FOR ANY PURPOSE, NOTIFY LAB WITHIN 5 DAYS.  LOWEST DETECTABLE LIMITS FOR URINE DRUG SCREEN Drug Class                     Cutoff (ng/mL) Amphetamine and metabolites    1000 Barbiturate and metabolites    200 Benzodiazepine  200 Tricyclics and metabolites     300 Opiates and metabolites        300 Cocaine and metabolites        300 THC                            50 Performed at [facility], [address] [place], [place] [address]   CBC with Diff     Status: None   Collection Time: 08/23/21 12:06 PM  Result Value Ref Range   WBC 7.7 4.0 - 10.5 K/uL   RBC 4.71 4.22 - 5.81 MIL/uL   Hemoglobin 14.3 13.0 - 17.0 g/dL   HCT 53.2 99.2 - 42.6 %   MCV 91.5 80.0 - 100.0 fL   MCH 30.4 26.0 - 34.0 pg   MCHC 33.2 30.0 - 36.0 g/dL   RDW 83.4 19.6 - 22.2 %   Platelets 348 150 - 400 K/uL   nRBC 0.0 0.0 - 0.2 %   Neutrophils Relative % 72 %   Neutro Abs 5.4 1.7 - 7.7 K/uL   Lymphocytes Relative 20 %   Lymphs Abs 1.6 0.7 - 4.0 K/uL   Monocytes Relative 8 %   Monocytes Absolute 0.6 0.1 - 1.0 K/uL   Eosinophils Relative 0 %   Eosinophils Absolute 0.0 0.0 - 0.5 K/uL   Basophils Relative 0 %   Basophils Absolute 0.0 0.0 - 0.1 K/uL   Immature Granulocytes 0 %   Abs Immature Granulocytes 0.02 0.00 - 0.07 K/uL    Comment: Performed at [facility], [address], [place], [place] [address]  Salicylate level     Status: Abnormal   Collection Time: 08/23/21 12:06 PM  Result Value Ref Range   Salicylate Lvl <7.0 (L) 7.0 - 30.0 mg/dL    Comment: Performed at [facility], [address] [place], [place] [address]  Acetaminophen level     Status: Abnormal   Collection Time: 08/23/21 12:06 PM  Result Value Ref Range   Acetaminophen (Tylenol), Serum <10 (L) 10 - 30 ug/mL    Comment: (NOTE) Therapeutic concentrations vary  significantly. A range of 10-30 ug/mL  may be an effective concentration for many patients. However, some  are best treated at concentrations outside of this range. Acetaminophen concentrations >150 ug/mL at 4 hours after ingestion  and >50 ug/mL at 12 hours after ingestion are often associated with  toxic reactions.  Performed at [facility], [address] [place], [place] [address]   Ammonia     Status: None   Collection Time: 08/23/21 12:07 PM  Result Value Ref Range   Ammonia 29 9 - 35 umol/L    Comment: Performed at [facility], [address] [place], [place] [address]  Resp Panel by RT-PCR (Flu A&B, Covid) Nasopharyngeal Swab     Status: None   Collection Time: 08/23/21  9:04 PM   Specimen: Nasopharyngeal Swab; Nasopharyngeal(NP) swabs in vial transport medium  Result Value Ref Range   SARS Coronavirus 2 by RT PCR NEGATIVE NEGATIVE    Comment: (NOTE) SARS-CoV-2 target nucleic acids are NOT DETECTED.  The SARS-CoV-2 RNA is generally detectable in upper respiratory specimens during the acute phase of infection. The lowest concentration of SARS-CoV-2 viral copies this assay can detect is 138 copies/mL. A negative result does not preclude SARS-Cov-2 infection and should not be used as the sole basis for treatment or other  patient management decisions. A negative result may occur with  improper specimen collection/handling, submission of specimen other than nasopharyngeal swab, presence of viral mutation(s) within the areas targeted by this assay, and inadequate number of viral copies(<138 copies/mL). A negative result must be combined with clinical observations, patient history, and epidemiological information. The expected result is Negative.  Fact Sheet for Patients:  BloggerCourse.com  Fact Sheet for Healthcare Providers:  SeriousBroker.it  This test is no t yet approved or cleared by the [place]  FDA and  has been authorized for detection and/or diagnosis of SARS-CoV-2 by FDA under an Emergency Use Authorization (EUA). This EUA will remain  in effect (meaning this test can be used) for the duration of the COVID-19 declaration under Section 564(b)(1) of the Act, 21 U.S.C.section 360bbb-3(b)(1), unless the authorization is terminated  or revoked sooner.       Influenza A by PCR NEGATIVE NEGATIVE   Influenza B by PCR NEGATIVE NEGATIVE    Comment: (NOTE) The Xpert Xpress SARS-CoV-2/FLU/RSV plus assay is intended as an aid in the diagnosis of influenza from Nasopharyngeal swab specimens and should not be used as a sole basis for treatment. Nasal washings and aspirates are unacceptable for Xpert Xpress SARS-CoV-2/FLU/RSV testing.  Fact Sheet for Patients: BloggerCourse.com  Fact Sheet for Healthcare Providers: SeriousBroker.it  This test is not yet approved or cleared by the [place] FDA and has been authorized for detection and/or diagnosis of SARS-CoV-2 by FDA under an Emergency Use Authorization (EUA). This EUA will remain in effect (meaning this test can be used) for the duration of the COVID-19 declaration under Section 564(b)(1) of the Act, 21 U.S.C. section 360bbb-3(b)(1), unless the authorization is terminated or revoked.  Performed at [facility], [address], [place], [place] [address]   HIV Antibody (routine testing w rflx)     Status: None   Collection Time: 08/23/21  9:07 PM  Result Value Ref Range   HIV Screen 4th Generation wRfx Non Reactive Non Reactive    Comment: Performed at [facility], [address] [place], [place] [address]  Valproic acid level     Status: None   Collection Time: 08/24/21  5:24 AM  Result Value Ref Range   Valproic Acid Lvl 69 50.0 - 100.0 ug/mL    Comment: Performed at [facility], [address] [place], [place] [address]  Vitamin B12     Status:  None   Collection Time: 08/24/21  5:24 AM  Result Value Ref Range   Vitamin B-12 231 180 - 914 pg/mL    Comment: (NOTE) This assay is not validated for testing neonatal or myeloproliferative syndrome specimens for Vitamin B12 levels. Performed at [facility], [address], [place], [place] [address]   Folate     Status: None   Collection Time: 08/24/21  5:24 AM  Result Value Ref Range   Folate 13.9 >5.9 ng/mL    Comment: Performed at [facility], [address], [place], [place] [address]  Troponin I (High Sensitivity)     Status: None   Collection Time: 08/24/21  5:24 AM  Result Value Ref Range   Troponin I (High Sensitivity) 4 <18 ng/L    Comment: (NOTE) Elevated high sensitivity troponin I (hsTnI) values and significant  changes across serial measurements may suggest ACS but many other  chronic and acute conditions are known to elevate hsTnI results.  Refer to the "Links" section for chest  pain algorithms and additional  guidance. Performed at [facility], [address] [place], [place] [address]     CT Head Wo Contrast  Result Date: 08/23/2021 CLINICAL DATA:  AMS s/p fall EXAM: CT HEAD WITHOUT CONTRAST TECHNIQUE: Contiguous axial images were obtained from the base of the skull through the vertex without intravenous contrast. COMPARISON:  None. FINDINGS: Brain: No evidence of acute infarction, hemorrhage, hydrocephalus, extra-axial collection or mass lesion/mass effect. Vascular: No hyperdense vessel identified. Skull: Small right frontal scalp contusion without acute fracture. Sinuses/Orbits: Clear visualized sinuses.  Unremarkable orbits. Other: No mastoid effusions. IMPRESSION: 1. No evidence of acute intracranial abnormality. 2. Small right frontal scalp contusion without acute fracture. Electronically Signed   By: Feliberto Harts M.D.   On: 08/23/2021 13:33   MR Brain W and Wo Contrast  Result Date: 08/23/2021 CLINICAL DATA:  Neuro deficit,  acute, stroke suspected EXAM: MRI HEAD WITHOUT AND WITH CONTRAST TECHNIQUE: Multiplanar, multiecho pulse sequences of the brain and surrounding structures were obtained without and with intravenous contrast. CONTRAST:  39mL GADAVIST GADOBUTROL 1 MMOL/ML IV SOLN COMPARISON:  CT Head from the same day FINDINGS: Brain: No acute infarction, hemorrhage, hydrocephalus, extra-axial collection or mass lesion. Mild frontal predominant T2 hyperintensities in the white matter. The cerebellar tonsils extend up to 6 mm below the foramen magnum with mildly pointed morphology on the right and mild crowding of the posterior fossa. No abnormal enhancement. Vascular: Major arterial flow voids are maintained skull base. Skull and upper cervical spine: Normal marrow signal. Sinuses/Orbits: Mild paranasal sinus mucosal thickening. Other: Trace mastoid effusions. IMPRESSION: 1. No acute infarct. 2. The cerebellar tonsils extend up to 6 mm below the foramen magnum with mildly pointed morphology on the right and mild crowding of the posterior fossa. 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 3. Mild frontal predominant T2 hyperintensities in the white matter. This finding is nonspecific but can be seen in the setting of chronic microvascular ischemia, a demyelinating process such as multiple sclerosis, or chronic migraines. Electronically Signed   By: Feliberto Harts M.D.   On: 08/23/2021 18:56   MR Cervical Spine Wo Contrast  Result Date: 08/23/2021 EXAM: MRI CERVICAL SPINE WITHOUT CONTRAST TECHNIQUE: Multiplanar, multisequence MR imaging of the cervical spine was performed. No intravenous contrast was administered. COMPARISON:  None. FINDINGS: Alignment: Straightening of the normal cervical lordosis. No substantial sagittal subluxation. Vertebrae: Vertebral body heights are maintained. No focal marrow edema to suggest acute fracture or discitis/osteomyelitis. Cord: Normal cord signal. Posterior Fossa, vertebral  arteries, paraspinal tissues: Please see concurrent MRI brain for evaluation of posterior fossa and description of inferior cerebellar tonsillar descent. Visualized vertebral artery flow voids are maintained. Disc levels: C2-C3: No significant disc protrusion, foraminal stenosis, or canal stenosis. C3-C4: Small right eccentric posterior disc osteophyte complex and right greater than left facet and uncovertebral hypertrophy. Resulting moderate right and mild left foraminal stenosis. Mild canal stenosis. C4-C5: Small central disc protrusion which contacts and flattens the ventral cord. Mild canal stenosis without significant foraminal stenosis. C5-C6: Small central disc protrusion which contacts and flattens the ventral cord. Right greater than left uncovertebral hypertrophy. Mild canal stenosis without significant foraminal stenosis. C6-C7: Posterior disc osteophyte complex and mild bilateral uncovertebral hypertrophy. Mild bilateral foraminal stenosis and mild canal stenosis. C7-T1: No significant disc protrusion, foraminal stenosis, or canal stenosis. IMPRESSION: 1. Mild canal stenosis at C3-C4, C4-C5, C5-C6, and C6-C7. Disc flattens the ventral cord at C4-C5 and C5-C6. 2. Moderate right and mild  left foraminal stenosis at C3-C4. 3. Mild bilateral foraminal stenosis at C6-C7. 4. Please see concurrent MRI brain for description of inferior cerebellar tonsillar descent. Electronically Signed   By: Feliberto Harts M.D.   On: 08/23/2021 19:02   MR THORACIC SPINE WO CONTRAST  Result Date: 08/24/2021 CLINICAL DATA:  Mid back pain with neuro deficit.  Hallucinations EXAM: MRI THORACIC SPINE WITHOUT CONTRAST TECHNIQUE: Multiplanar, multisequence MR imaging of the thoracic spine was performed. No intravenous contrast was administered. COMPARISON:  None. FINDINGS: Alignment:  Physiologic Vertebrae: No fracture, evidence of discitis, or bone lesion. Cord: The central canal is intermittently visible, within normal limits.  No evidence of demyelination or mass. Paraspinal and other soft tissues: Negative for perispinal mass or inflammation. Disc levels: Incomplete segmentation at T3-4. Small disc protrusions which are right paracentral at T2-3, central with cord contact at T5-6, and central with ventral cord indentation at T7-8. Focal degenerative facet spurring at T4-5. Diffusely patent foramina and spinal canal. IMPRESSION: 1. No acute finding or specific cause for symptoms. 2. 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 No compressive stenosis. Electronically Signed   By: Tiburcio Pea M.D.   On: 08/24/2021 05:14   MR LUMBAR SPINE WO CONTRAST  Result Date: 08/23/2021 CLINICAL DATA:  Low back pain, progressive neurologic deficit EXAM: MRI LUMBAR SPINE WITHOUT CONTRAST TECHNIQUE: Multiplanar, multisequence MR imaging of the lumbar spine was performed. No intravenous contrast was administered. COMPARISON:  None. FINDINGS: Segmentation: Transitional lumbosacral anatomy. For the purposes of this dictation, there is partially lumbarized S1 segment. Alignment: Straightening of the normal lumbar lordosis. No substantial sagittal subluxation. Vertebrae: Vertebral body heights are maintained. No focal marrow edema to suggest acute fracture discitis/osteomyelitis. No suspicious bone lesions. Conus medullaris and cauda equina: Conus extends to the L1-L2 level. Conus appears normal. Paraspinal and other soft tissues: Unremarkable. Disc levels: T12-L1: No significant disc protrusion, foraminal stenosis, or canal stenosis. L1-L2: No significant disc protrusion, foraminal stenosis, or canal stenosis. L2-L3: Slight disc bulging and mild facet arthropathy. No significant canal stenosis. Mild bilateral foraminal stenosis. L3-L4: Disc desiccation height loss. Broad disc bulge and mild bilateral facet hypertrophy. Resulting moderate canal stenosis and mild bilateral foraminal stenosis. L4-L5: Mild disc height loss and  desiccation. Broad disc bulge and mild bilateral facet arthropathy. Resulting mild canal stenosis and mild bilateral foraminal stenosis. L5-S1: No significant disc protrusion, foraminal stenosis, or canal stenosis. IMPRESSION: 1. Transitional lumbosacral anatomy. For the purposes of this dictation, there is partially lumbarized S1 segment. Correlation with radiographs is recommended prior to any operative intervention. 2. Moderate canal stenosis at L3-L4.  Mild canal stenosis at L4-L5. 3. Mild bilateral foraminal stenosis at L2-L3, L3-L4, and L4-L5. Electronically Signed   By: Feliberto Harts M.D.   On: 08/23/2021 19:11    Review of Systems  Respiratory:  Negative for shortness of breath.   Cardiovascular:  Negative for chest pain.  Gastrointestinal:  Negative for abdominal pain, constipation, diarrhea, nausea and vomiting.  Neurological:  Negative for weakness and headaches.  Psychiatric/Behavioral:  Positive for self-injury (Left wrist). Negative for agitation and suicidal ideas. The patient is not nervous/anxious and is not hyperactive.   Blood pressure 103/74, pulse 71, temperature 98.6 F (37 C), temperature source Oral, resp. rate 16, SpO2 99 %. Physical Exam Vitals and nursing note reviewed.  Constitutional:      General: He is not in acute distress.    Appearance: Normal appearance. He is normal weight. He is not ill-appearing or toxic-appearing.  HENT:  Head: Normocephalic and atraumatic.  Pulmonary:     Effort: Pulmonary effort is normal.  Musculoskeletal:        General: Normal range of motion.  Neurological:     Mental Status: He is alert and oriented to person, place, and time.    Assessment/Plan: Lawrence George is a 35 y.o. male admitted medically for 08/23/2021 11:29 AM for Seizure-like activity and acute psychosis. He carries the no psychiatric diagnoses and has a past medical history of CAD not on medications.Psychiatry was consulted for Acute Psychosis and a  Suicide Attempt.   He does not meet criteria for inpatient psych admission.  He was not on any psychiatric medications prior to admission. On initial examination, patient laying in bed but pleasant and answer all questions.    As his psychotic symptoms started after he began having seizure like episodes they appear to be post-ictal based.  He is currently being brought up to therapeutic range by Neurology.  We will start Risperdal as this lowers the seizure threshold the least.  We recommend that he stay on this for 6 months and as long as he remains seizure free can be tapered off.  His most recent EKG shows a QTC of 407.  We have discussed these recommendations with the Primary Care Team.    -Start Risperdal 1 mg QHS   Continue rest of care per Primary Team Psychiatry will continue to follow the patient    Lauro Franklin 08/24/2021, 8:39 AM

## 2021-08-24 NOTE — Progress Notes (Addendum)
PROGRESS NOTE    Lawrence George  XHB:[id number] DOB: [birth year] DOA: 08/23/2021 PCP: No primary care provider on file.   Brief Narrative:  67 Spanish-speaking male with history of CAD not on home medication admitted for confusion, auditory and visual hallucination and right-sided headache.  He also recently had a suicide attempt by overdosing on sleeping pills 2 weeks ago.  Patient was seen by neurology in the ER.  CT head, MRI brain was negative for acute infarct but showed mild Chiari malformation.  MRI C-spine, thoracic and lumbar spine showed moderate canal stenosis and small thoracic disc protrusion with osteoarthritis.  Neurology recommended obtaining EEG and lumbar puncture but also started on Depakote 1500 mg loading dose followed by 500 mg twice daily.   Assessment & Plan:   Principal Problem:   Psychosis (HCC) Active Problems:   Suicide attempt [facility]   Spinal stenosis   CAD (coronary artery disease)   Acute psychosis (HCC)   Acute psychosis Concern for seizures -Etiology is currently not clear.  Alcohol, salicylate, Tylenol levels are negative.  UDS and ammonia levels are normal.  Status post CT head, MRI brain, cervical, thoracic and lumbar spine did not show acute pathology besides canal narrowing. -B12 folate normal.  Check TSH - Neurology recommended lumbar puncture and EEG - Depakote 1500 mg loading followed by 5 mg twice daily - Lumbar puncture ordered with appropriate labs   Concern for suicide attempt -Currently on suicide precaution.  Psychiatry has been consulted   Cervical and lumbar spinal canal stenosis, foraminal stenosis -Currently does not have any acute compressive symptoms but due to extent of his spinal disease of benefit from outpatient neurosurgery follow-up   CAD -Currently patient is chest pain-free.  Alcohol use - We will place him on alcohol withdrawal protocol.  Multivitamin, folate and thiamine    DVT prophylaxis: SCDs Start:  08/24/21 0050 Code Status: Full Code Family Communication:  Wife at bedside  Status is: Inpatient  Remains inpatient appropriate because:Inpatient level of care appropriate due to severity of illness.  We will get EEG results and lumbar puncture today to rule out any metabolic causes.  Once ruled out he will need psych evaluation  Dispo: The patient is from: Home              Anticipated d/c is to: Home              Patient currently is not medically stable to d/c.   Difficult to place patient No       Nutritional status           There is no height or weight on file to calculate BMI.           Subjective: Patient seen and examined at bedside this morning.  Interpreter service used ID [id number].  Patient states he typically drinks 1-5 beers daily but has not really had much to drink over the last 3-4 days but had been asking around his friends and family for beers.  He started having auditory and visual hallucinations about 2 days ago and his wife brought him into the hospital yesterday.  This morning at bedside he denies any complaints, wife tells me that his mentation appears to be at baseline.  Review of Systems Otherwise negative except as per HPI, including: General: Denies fever, chills, night sweats or unintended weight loss. Resp: Denies cough, wheezing, shortness of breath. Cardiac: Denies chest pain, palpitations, orthopnea, paroxysmal nocturnal dyspnea. GI: Denies abdominal pain, nausea, vomiting, diarrhea  or constipation GU: Denies dysuria, frequency, hesitancy or incontinence MS: Denies muscle aches, joint pain or swelling Neuro: Denies headache, neurologic deficits (focal weakness, numbness, tingling), abnormal gait Psych: Denies anxiety, depression, SI/HI/AVH Skin: Denies new rashes or lesions ID: Denies sick contacts, exotic exposures, travel  Examination:  General exam: Appears calm and comfortable  Respiratory system: Clear to auscultation.  Respiratory effort normal. Cardiovascular system: S1 & S2 heard, RRR. No JVD, murmurs, rubs, gallops or clicks. No pedal edema. Gastrointestinal system: Abdomen is nondistended, soft and nontender. No organomegaly or masses felt. Normal bowel sounds heard. Central nervous system: Alert and oriented. No focal neurological deficits. Extremities: Symmetric 5 x 5 power. Skin: No rashes, lesions or ulcers Psychiatry: Judgement and insight appear normal. Mood & affect appropriate.     Objective: Vitals:   08/24/21 0100 08/24/21 0500 08/24/21 0700 08/24/21 0730  BP: 110/71 100/76 104/77 103/74  Pulse: 79 70 68 71  Resp: 16 14 12 16   Temp:      TempSrc:      SpO2: 100% 98% 99% 99%   No intake or output data in the 24 hours ending 08/24/21 0734 There were no vitals filed for this visit.   Data Reviewed:   CBC: Recent Labs  Lab 08/23/21 1206  WBC 7.7  NEUTROABS 5.4  HGB 14.3  HCT 43.1  MCV 91.5  PLT 348   Basic Metabolic Panel: Recent Labs  Lab 08/23/21 1206  NA 138  K 3.8  CL 105  CO2 26  GLUCOSE 77  BUN 9  CREATININE 0.85  CALCIUM 8.7*   GFR: CrCl cannot be calculated (Unknown ideal weight.). Liver Function Tests: Recent Labs  Lab 08/23/21 1206  AST 19  ALT 18  ALKPHOS 63  BILITOT 0.8  PROT 6.8  ALBUMIN 3.9   No results for input(s): LIPASE, AMYLASE in the last 168 hours. Recent Labs  Lab 08/23/21 1207  AMMONIA 29   Coagulation Profile: No results for input(s): INR, PROTIME in the last 168 hours. Cardiac Enzymes: No results for input(s): CKTOTAL, CKMB, CKMBINDEX, TROPONINI in the last 168 hours. BNP (last 3 results) No results for input(s): PROBNP in the last 8760 hours. HbA1C: No results for input(s): HGBA1C in the last 72 hours. CBG: No results for input(s): GLUCAP in the last 168 hours. Lipid Profile: No results for input(s): CHOL, HDL, LDLCALC, TRIG, CHOLHDL, LDLDIRECT in the last 72 hours. Thyroid Function Tests: No results for input(s):  TSH, T4TOTAL, FREET4, T3FREE, THYROIDAB in the last 72 hours. Anemia Panel: Recent Labs    08/24/21 0524  VITAMINB12 231  FOLATE 13.9   Sepsis Labs: No results for input(s): PROCALCITON, LATICACIDVEN in the last 168 hours.  Recent Results (from the past 240 hour(s))  Resp Panel by RT-PCR (Flu A&B, Covid) Nasopharyngeal Swab     Status: None   Collection Time: 08/23/21  9:04 PM   Specimen: Nasopharyngeal Swab; Nasopharyngeal(NP) swabs in vial transport medium  Result Value Ref Range Status   SARS Coronavirus 2 by RT PCR NEGATIVE NEGATIVE Final    Comment: (NOTE) SARS-CoV-2 target nucleic acids are NOT DETECTED.  The SARS-CoV-2 RNA is generally detectable in upper respiratory specimens during the acute phase of infection. The lowest concentration of SARS-CoV-2 viral copies this assay can detect is 138 copies/mL. A negative result does not preclude SARS-Cov-2 infection and should not be used as the sole basis for treatment or other patient management decisions. A negative result may occur with  improper specimen collection/handling, submission of  specimen other than nasopharyngeal swab, presence of viral mutation(s) within the areas targeted by this assay, and inadequate number of viral copies(<138 copies/mL). A negative result must be combined with clinical observations, patient history, and epidemiological information. The expected result is Negative.  Fact Sheet for Patients:  BloggerCourse.com  Fact Sheet for Healthcare Providers:  SeriousBroker.it  This test is no t yet approved or cleared by the [place] FDA and  has been authorized for detection and/or diagnosis of SARS-CoV-2 by FDA under an Emergency Use Authorization (EUA). This EUA will remain  in effect (meaning this test can be used) for the duration of the COVID-19 declaration under Section 564(b)(1) of the Act, 21 U.S.C.section 360bbb-3(b)(1), unless the  authorization is terminated  or revoked sooner.       Influenza A by PCR NEGATIVE NEGATIVE Final   Influenza B by PCR NEGATIVE NEGATIVE Final    Comment: (NOTE) The Xpert Xpress SARS-CoV-2/FLU/RSV plus assay is intended as an aid in the diagnosis of influenza from Nasopharyngeal swab specimens and should not be used as a sole basis for treatment. Nasal washings and aspirates are unacceptable for Xpert Xpress SARS-CoV-2/FLU/RSV testing.  Fact Sheet for Patients: BloggerCourse.com  Fact Sheet for Healthcare Providers: SeriousBroker.it  This test is not yet approved or cleared by the [place] FDA and has been authorized for detection and/or diagnosis of SARS-CoV-2 by FDA under an Emergency Use Authorization (EUA). This EUA will remain in effect (meaning this test can be used) for the duration of the COVID-19 declaration under Section 564(b)(1) of the Act, 21 U.S.C. section 360bbb-3(b)(1), unless the authorization is terminated or revoked.  Performed at [facility], [address] [place], [place] [address]          Radiology Studies: CT Head Wo Contrast  Result Date: 08/23/2021 CLINICAL DATA:  AMS s/p fall EXAM: CT HEAD WITHOUT CONTRAST TECHNIQUE: Contiguous axial images were obtained from the base of the skull through the vertex without intravenous contrast. COMPARISON:  None. FINDINGS: Brain: No evidence of acute infarction, hemorrhage, hydrocephalus, extra-axial collection or mass lesion/mass effect. Vascular: No hyperdense vessel identified. Skull: Small right frontal scalp contusion without acute fracture. Sinuses/Orbits: Clear visualized sinuses.  Unremarkable orbits. Other: No mastoid effusions. IMPRESSION: 1. No evidence of acute intracranial abnormality. 2. Small right frontal scalp contusion without acute fracture. Electronically Signed   By: Feliberto Harts M.D.   On: 08/23/2021 13:33   MR Brain W and Wo  Contrast  Result Date: 08/23/2021 CLINICAL DATA:  Neuro deficit, acute, stroke suspected EXAM: MRI HEAD WITHOUT AND WITH CONTRAST TECHNIQUE: Multiplanar, multiecho pulse sequences of the brain and surrounding structures were obtained without and with intravenous contrast. CONTRAST:  9mL GADAVIST GADOBUTROL 1 MMOL/ML IV SOLN COMPARISON:  CT Head from the same day FINDINGS: Brain: No acute infarction, hemorrhage, hydrocephalus, extra-axial collection or mass lesion. Mild frontal predominant T2 hyperintensities in the white matter. The cerebellar tonsils extend up to 6 mm below the foramen magnum with mildly pointed morphology on the right and mild crowding of the posterior fossa. No abnormal enhancement. Vascular: Major arterial flow voids are maintained skull base. Skull and upper cervical spine: Normal marrow signal. Sinuses/Orbits: Mild paranasal sinus mucosal thickening. Other: Trace mastoid effusions. IMPRESSION: 1. No acute infarct. 2. The cerebellar tonsils extend up to 6 mm below the foramen magnum with mildly pointed morphology on the right and mild crowding of the posterior fossa. 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 3. Mild frontal  predominant T2 hyperintensities in the white matter. This finding is nonspecific but can be seen in the setting of chronic microvascular ischemia, a demyelinating process such as multiple sclerosis, or chronic migraines. Electronically Signed   By: Feliberto Harts M.D.   On: 08/23/2021 18:56   MR Cervical Spine Wo Contrast  Result Date: 08/23/2021 EXAM: MRI CERVICAL SPINE WITHOUT CONTRAST TECHNIQUE: Multiplanar, multisequence MR imaging of the cervical spine was performed. No intravenous contrast was administered. COMPARISON:  None. FINDINGS: Alignment: Straightening of the normal cervical lordosis. No substantial sagittal subluxation. Vertebrae: Vertebral body heights are maintained. No focal marrow edema to suggest acute fracture or  discitis/osteomyelitis. Cord: Normal cord signal. Posterior Fossa, vertebral arteries, paraspinal tissues: Please see concurrent MRI brain for evaluation of posterior fossa and description of inferior cerebellar tonsillar descent. Visualized vertebral artery flow voids are maintained. Disc levels: C2-C3: No significant disc protrusion, foraminal stenosis, or canal stenosis. C3-C4: Small right eccentric posterior disc osteophyte complex and right greater than left facet and uncovertebral hypertrophy. Resulting moderate right and mild left foraminal stenosis. Mild canal stenosis. C4-C5: Small central disc protrusion which contacts and flattens the ventral cord. Mild canal stenosis without significant foraminal stenosis. C5-C6: Small central disc protrusion which contacts and flattens the ventral cord. Right greater than left uncovertebral hypertrophy. Mild canal stenosis without significant foraminal stenosis. C6-C7: Posterior disc osteophyte complex and mild bilateral uncovertebral hypertrophy. Mild bilateral foraminal stenosis and mild canal stenosis. C7-T1: No significant disc protrusion, foraminal stenosis, or canal stenosis. IMPRESSION: 1. Mild canal stenosis at C3-C4, C4-C5, C5-C6, and C6-C7. Disc flattens the ventral cord at C4-C5 and C5-C6. 2. Moderate right and mild left foraminal stenosis at C3-C4. 3. Mild bilateral foraminal stenosis at C6-C7. 4. Please see concurrent MRI brain for description of inferior cerebellar tonsillar descent. Electronically Signed   By: Feliberto Harts M.D.   On: 08/23/2021 19:02   MR THORACIC SPINE WO CONTRAST  Result Date: 08/24/2021 CLINICAL DATA:  Mid back pain with neuro deficit.  Hallucinations EXAM: MRI THORACIC SPINE WITHOUT CONTRAST TECHNIQUE: Multiplanar, multisequence MR imaging of the thoracic spine was performed. No intravenous contrast was administered. COMPARISON:  None. FINDINGS: Alignment:  Physiologic Vertebrae: No fracture, evidence of discitis, or bone  lesion. Cord: The central canal is intermittently visible, within normal limits. No evidence of demyelination or mass. Paraspinal and other soft tissues: Negative for perispinal mass or inflammation. Disc levels: Incomplete segmentation at T3-4. Small disc protrusions which are right paracentral at T2-3, central with cord contact at T5-6, and central with ventral cord indentation at T7-8. Focal degenerative facet spurring at T4-5. Diffusely patent foramina and spinal canal. IMPRESSION: 1. No acute finding or specific cause for symptoms. 2. 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 No compressive stenosis. Electronically Signed   By: Tiburcio Pea M.D.   On: 08/24/2021 05:14   MR LUMBAR SPINE WO CONTRAST  Result Date: 08/23/2021 CLINICAL DATA:  Low back pain, progressive neurologic deficit EXAM: MRI LUMBAR SPINE WITHOUT CONTRAST TECHNIQUE: Multiplanar, multisequence MR imaging of the lumbar spine was performed. No intravenous contrast was administered. COMPARISON:  None. FINDINGS: Segmentation: Transitional lumbosacral anatomy. For the purposes of this dictation, there is partially lumbarized S1 segment. Alignment: Straightening of the normal lumbar lordosis. No substantial sagittal subluxation. Vertebrae: Vertebral body heights are maintained. No focal marrow edema to suggest acute fracture discitis/osteomyelitis. No suspicious bone lesions. Conus medullaris and cauda equina: Conus extends to the L1-L2 level. Conus appears normal. Paraspinal and other soft tissues: Unremarkable. Disc  levels: T12-L1: No significant disc protrusion, foraminal stenosis, or canal stenosis. L1-L2: No significant disc protrusion, foraminal stenosis, or canal stenosis. L2-L3: Slight disc bulging and mild facet arthropathy. No significant canal stenosis. Mild bilateral foraminal stenosis. L3-L4: Disc desiccation height loss. Broad disc bulge and mild bilateral facet hypertrophy. Resulting moderate canal stenosis  and mild bilateral foraminal stenosis. L4-L5: Mild disc height loss and desiccation. Broad disc bulge and mild bilateral facet arthropathy. Resulting mild canal stenosis and mild bilateral foraminal stenosis. L5-S1: No significant disc protrusion, foraminal stenosis, or canal stenosis. IMPRESSION: 1. Transitional lumbosacral anatomy. For the purposes of this dictation, there is partially lumbarized S1 segment. Correlation with radiographs is recommended prior to any operative intervention. 2. Moderate canal stenosis at L3-L4.  Mild canal stenosis at L4-L5. 3. Mild bilateral foraminal stenosis at L2-L3, L3-L4, and L4-L5. Electronically Signed   By: Feliberto Harts M.D.   On: 08/23/2021 19:11        Scheduled Meds:  divalproex  500 mg Oral Q12H   Continuous Infusions:   LOS: 0 days   Time spent= 35 mins    Johany Hansman Joline Maxcy, MD [facility]  If 7PM-7AM, please contact night-coverage  08/24/2021, 7:34 AM

## 2021-08-24 NOTE — Procedures (Signed)
Patient Name: Lawrence George  MRN: [id number]  Epilepsy Attending: Charlsie Quest  Referring Physician/Provider: Kara Mead, NP Date: 08/24/2021 Duration: 22.48 mins  Patient history: 35 year old male who presents with 6 episodes in the past 5 months of right arm twitching followed by chest clutching with rapid respirations and generalized body shaking with subsequent confusion and lethargy. EEG to evaluate for seizure  Level of alertness: Awake, asleep  AEDs during EEG study: VPA, Ativan  Technical aspects: This EEG study was done with scalp electrodes positioned according to the 10-20 International system of electrode placement. Electrical activity was acquired at a sampling rate of 500Hz  and reviewed with a high frequency filter of 70Hz  and a low frequency filter of 1Hz . EEG data were recorded continuously and digitally stored.   Description: The posterior dominant rhythm consists of 10 Hz activity of moderate voltage (25-35 uV) seen predominantly in posterior head regions, symmetric and reactive to eye opening and eye closing. Sleep was characterized by vertex waves, sleep spindles (12 to 14 Hz), maximal frontocentral region. Hyperventilation and photic stimulation were not performed.     IMPRESSION: This study is within normal limits. No seizures or epileptiform discharges were seen throughout the recording.  Josslynn Mentzer 

## 2021-08-24 NOTE — Progress Notes (Signed)
EEG complete - results pending 

## 2021-08-24 NOTE — Progress Notes (Signed)
Subjective: Wife reports that he is completely back to baseline.  No hallucinations for the past 2 days no further seizures.  Exam: Vitals:   08/24/21 1145 08/24/21 1500  BP: 121/77 119/84  Pulse: 86 81  Resp: (!) 21 16  Temp:    SpO2: 99% 100%   Gen: In bed, NAD Resp: non-labored breathing, no acute distress Abd: soft, nt  Neuro: MS: Awake, alert, interactive and appropriate CN: Visual fields full, face symmetric Motor: No drift  Pertinent Labs: CSF WBC 2 CSF RBC 34 Protein 35 Glucose 67   Impression: 35 year old male with new onset(over the past 5 months) of episodes of right-sided shaking and jerking with loss of consciousness most consistent with seizure.  He also has new onset psychosis, though his wife states that this is something that comes and goes, and is intermittent.  With his return to normal(per her), I do wonder about seizure related psychosis.  Recommendations: 1) continue Depakote 500 mg twice daily 2) appreciate psychiatric assistance 3) we will continue to follow.  Ritta Slot, MD [facility] [phone]  If 7pm- 7am, please page neurology on call as listed in AMION.

## 2021-08-25 ENCOUNTER — Other Ambulatory Visit (HOSPITAL_COMMUNITY): Payer: Self-pay

## 2021-08-25 LAB — BASIC METABOLIC PANEL
Anion gap: 8 (ref 5–15)
BUN: 9 mg/dL (ref 6–20)
CO2: 24 mmol/L (ref 22–32)
Calcium: 8.8 mg/dL — ABNORMAL LOW (ref 8.9–10.3)
Chloride: 108 mmol/L (ref 98–111)
Creatinine, Ser: 0.9 mg/dL (ref 0.61–1.24)
GFR, Estimated: >60 mL/min (ref >60)
Glucose, Bld: 92 mg/dL (ref 70–99)
Potassium: 4 mmol/L (ref 3.5–5.1)
Sodium: 140 mmol/L (ref 135–145)

## 2021-08-25 LAB — MISC LABCORP TEST (SEND OUT): Labcorp test code: 505535

## 2021-08-25 LAB — LIPID PANEL
Cholesterol: 109 mg/dL (ref 0–200)
HDL: 36 mg/dL — ABNORMAL LOW (ref >40)
LDL Cholesterol: 64 mg/dL (ref 0–99)
Total CHOL/HDL Ratio: 3 RATIO
Triglycerides: 47 mg/dL (ref <150)
VLDL: 9 mg/dL (ref 0–40)

## 2021-08-25 LAB — CBC
HCT: 39.1 % (ref 39.0–52.0)
Hemoglobin: 13.4 g/dL (ref 13.0–17.0)
MCH: 30.7 pg (ref 26.0–34.0)
MCHC: 34.3 g/dL (ref 30.0–36.0)
MCV: 89.5 fL (ref 80.0–100.0)
Platelets: 281 10*3/uL (ref 150–400)
RBC: 4.37 MIL/uL (ref 4.22–5.81)
RDW: 11.5 % (ref 11.5–15.5)
WBC: 6.8 10*3/uL (ref 4.0–10.5)
nRBC: 0 % (ref 0.0–0.2)

## 2021-08-25 LAB — MAGNESIUM: Magnesium: 2.2 mg/dL (ref 1.7–2.4)

## 2021-08-25 LAB — HEMOGLOBIN A1C
Hgb A1c MFr Bld: 5.4 % (ref 4.8–5.6)
Mean Plasma Glucose: 108.28 mg/dL

## 2021-08-25 LAB — LYME DISEASE SEROLOGY W/REFLEX: Lyme Total Antibody EIA: NEGATIVE

## 2021-08-25 MED ORDER — DIVALPROEX SODIUM 125 MG PO CSDR
500.0000 mg | DELAYED_RELEASE_CAPSULE | Freq: Two times a day (BID) | ORAL | 0 refills | Status: AC
  Filled 2021-08-25: qty 210, 26d supply, fill #0

## 2021-08-25 MED ORDER — RISPERIDONE 1 MG PO TABS
1.0000 mg | ORAL_TABLET | Freq: Every day | ORAL | 0 refills | Status: AC
  Filled 2021-08-25: qty 30, 30d supply, fill #0

## 2021-08-25 NOTE — Discharge Planning (Signed)
RNCM consulted for medication assistance. RNCM reviewed chart and spoke with the pt about [facility] MATCH program ($3 co pay for each Rx through MATCH program, does not include refills, 7 day expiration of MATCH letter and choice of pharmacies). Pt is eligible for [facility]ATCH program (unable to find pt listed in [facility] per cardholder name inquiry) and has agreed to accept MATCH with co-pay override. [facility] information entered; [facility] to fill and deliver to pt at bedside prior to discharge home today. RNCM updated EDP and ED RN.     

## 2021-08-25 NOTE — ED Notes (Signed)
Called lab to follow up on pending add on labs not in process. Lab tech to add on shortly.

## 2021-08-25 NOTE — ED Notes (Signed)
Meds delivered to pt via [facility].

## 2021-08-25 NOTE — Consult Note (Addendum)
Reason for Consult: Acute Psychosis, Suicide Attempt Referring Physician: John Giovanni, MD  Lawrence George is an 35 y.o. male.  HPI:  Lawrence George is a 35 y.o. male admitted medically for 08/23/2021 11:29 AM for Seizure-like activity and acute psychosis. He carries the no psychiatric diagnoses and has a past medical history of CAD not on medications. Psychiatry was consulted for Acute Psychosis and a Suicide Attempt - notably suicide attempt was in the context of psychosis 2+ weeks ago which has since resolved. It is unknown if he just took too much medicine or tried to end his life.   He does not meet criteria for inpatient psych admission.  He was not on any psychiatric medications prior to admission. On initial examination, patient laying in bed but pleasant and answer all questions.  Dr. Renaldo Fiddler (PGY-II) served as my scribe; I completed all portions of interview.   Interpretor services where used. Discussed we were consulted because of his AVH.  He reports that he has not had a seizure for 1 month. Reports he stopped smoking and drinking and that he still heard whispering and so asked his family to take him to the hospital.  Reports at most drinking 2-3 beers. He states he thinks the voices started because he was drinking and taking his heart medications. Discussed we started a medicine for the voices, Risperdal, and that since his AVH started about 2 months ago we recommend he stay on it for 6 months.  Then he follow up without patient to then decide to taper.  Discussed if he is seizure and psychosis free for 6 months recommend trying a taper.  He reports that he is being linked with a clinic through his work. He reports no SI, HI, or AVH (last time was 4-5 days ago). He reports sleeping and eating well.  Discussed that even though he is AVH free it is important to stay on the Risperdal for 6 months.  He reports mild back pain from his lumbar puncture.  He has no other  concerns at present.    Past Medical History:  Diagnosis Date   Coronary artery disease     History reviewed. No pertinent surgical history.  History reviewed. No pertinent family history.  Social History:  reports that he has never smoked. He has never used smokeless tobacco. He reports that he does not currently use alcohol. He reports that he does not use drugs.  Allergies: No Known Allergies  Medications: I have reviewed the patient's current medications. Prior to Admission: (Not in a hospital admission)  Results for orders placed or performed during the hospital encounter of 08/23/21 (from the past 48 hour(s))  Comprehensive metabolic panel     Status: Abnormal   Collection Time: 08/23/21 12:06 PM  Result Value Ref Range   Sodium 138 135 - 145 mmol/L   Potassium 3.8 3.5 - 5.1 mmol/L   Chloride 105 98 - 111 mmol/L   CO2 26 22 - 32 mmol/L   Glucose, Bld 77 70 - 99 mg/dL    Comment: Glucose reference range applies only to samples taken after fasting for at least 8 hours.   BUN 9 6 - 20 mg/dL   Creatinine, Ser 3.84 0.61 - 1.24 mg/dL   Calcium 8.7 (L) 8.9 - 10.3 mg/dL   Total Protein 6.8 6.5 - 8.1 g/dL   Albumin 3.9 3.5 - 5.0 g/dL   AST 19 15 - 41 U/L   ALT 18 0 - 44 U/L  Alkaline Phosphatase 63 38 - 126 U/L   Total Bilirubin 0.8 0.3 - 1.2 mg/dL   GFR, Estimated >16 >10 mL/min    Comment: (NOTE) Calculated using the CKD-EPI Creatinine Equation (2021)    Anion gap 7 5 - 15    Comment: Performed at [facility], [address], [place], [place] [address]  Ethanol     Status: None   Collection Time: 08/23/21 12:06 PM  Result Value Ref Range   Alcohol, Ethyl (B) <10 <10 mg/dL    Comment: (NOTE) Lowest detectable limit for serum alcohol is 10 mg/dL.  For medical purposes only. Performed at [facility], [address] [place], [place] [address]   Urine rapid drug screen (hosp performed)     Status: None   Collection Time: 08/23/21 12:06 PM  Result  Value Ref Range   Opiates NONE DETECTED NONE DETECTED   Cocaine NONE DETECTED NONE DETECTED   Benzodiazepines NONE DETECTED NONE DETECTED   Amphetamines NONE DETECTED NONE DETECTED   Tetrahydrocannabinol NONE DETECTED NONE DETECTED   Barbiturates NONE DETECTED NONE DETECTED    Comment: (NOTE) DRUG SCREEN FOR MEDICAL PURPOSES ONLY.  IF CONFIRMATION IS NEEDED FOR ANY PURPOSE, NOTIFY LAB WITHIN 5 DAYS.  LOWEST DETECTABLE LIMITS FOR URINE DRUG SCREEN Drug Class                     Cutoff (ng/mL) Amphetamine and metabolites    1000 Barbiturate and metabolites    200 Benzodiazepine                 200 Tricyclics and metabolites     300 Opiates and metabolites        300 Cocaine and metabolites        300 THC                            50 Performed at [facility], [address] [place], [place] [address]   CBC with Diff     Status: None   Collection Time: 08/23/21 12:06 PM  Result Value Ref Range   WBC 7.7 4.0 - 10.5 K/uL   RBC 4.71 4.22 - 5.81 MIL/uL   Hemoglobin 14.3 13.0 - 17.0 g/dL   HCT 82.9 56.2 - 13.0 %   MCV 91.5 80.0 - 100.0 fL   MCH 30.4 26.0 - 34.0 pg   MCHC 33.2 30.0 - 36.0 g/dL   RDW 86.5 78.4 - 69.6 %   Platelets 348 150 - 400 K/uL   nRBC 0.0 0.0 - 0.2 %   Neutrophils Relative % 72 %   Neutro Abs 5.4 1.7 - 7.7 K/uL   Lymphocytes Relative 20 %   Lymphs Abs 1.6 0.7 - 4.0 K/uL   Monocytes Relative 8 %   Monocytes Absolute 0.6 0.1 - 1.0 K/uL   Eosinophils Relative 0 %   Eosinophils Absolute 0.0 0.0 - 0.5 K/uL   Basophils Relative 0 %   Basophils Absolute 0.0 0.0 - 0.1 K/uL   Immature Granulocytes 0 %   Abs Immature Granulocytes 0.02 0.00 - 0.07 K/uL    Comment: Performed at [facility], [address] [place], [place] [address]  Salicylate level     Status: Abnormal   Collection Time: 08/23/21 12:06 PM  Result Value Ref Range   Salicylate Lvl <7.0 (L) 7.0 - 30.0 mg/dL    Comment: Performed at [facility], [address],  [place], [place] [address]  Acetaminophen level     Status: Abnormal   Collection Time: 08/23/21 12:06 PM  Result Value Ref Range   Acetaminophen (Tylenol), Serum <10 (L) 10 - 30 ug/mL    Comment: (NOTE) Therapeutic concentrations vary significantly. A range of 10-30 ug/mL  may be an effective concentration for many patients. However, some  are best treated at concentrations outside of this range. Acetaminophen concentrations >150 ug/mL at 4 hours after ingestion  and >50 ug/mL at 12 hours after ingestion are often associated with  toxic reactions.  Performed at [facility], [address] [place], [place] [address]   Ammonia     Status: None   Collection Time: 08/23/21 12:07 PM  Result Value Ref Range   Ammonia 29 9 - 35 umol/L    Comment: Performed at [facility], [address], [place], [place] [address]  Resp Panel by RT-PCR (Flu A&B, Covid) Nasopharyngeal Swab     Status: None   Collection Time: 08/23/21  9:04 PM   Specimen: Nasopharyngeal Swab; Nasopharyngeal(NP) swabs in vial transport medium  Result Value Ref Range   SARS Coronavirus 2 by RT PCR NEGATIVE NEGATIVE    Comment: (NOTE) SARS-CoV-2 target nucleic acids are NOT DETECTED.  The SARS-CoV-2 RNA is generally detectable in upper respiratory specimens during the acute phase of infection. The lowest concentration of SARS-CoV-2 viral copies this assay can detect is 138 copies/mL. A negative result does not preclude SARS-Cov-2 infection and should not be used as the sole basis for treatment or other patient management decisions. A negative result may occur with  improper specimen collection/handling, submission of specimen other than nasopharyngeal swab, presence of viral mutation(s) within the areas targeted by this assay, and inadequate number of viral copies(<138 copies/mL). A negative result must be combined with clinical observations, patient history, and epidemiological information. The expected result  is Negative.  Fact Sheet for Patients:  BloggerCourse.com  Fact Sheet for Healthcare Providers:  SeriousBroker.it  This test is no t yet approved or cleared by the [place] FDA and  has been authorized for detection and/or diagnosis of SARS-CoV-2 by FDA under an Emergency Use Authorization (EUA). This EUA will remain  in effect (meaning this test can be used) for the duration of the COVID-19 declaration under Section 564(b)(1) of the Act, 21 U.S.C.section 360bbb-3(b)(1), unless the authorization is terminated  or revoked sooner.       Influenza A by PCR NEGATIVE NEGATIVE   Influenza B by PCR NEGATIVE NEGATIVE    Comment: (NOTE) The Xpert Xpress SARS-CoV-2/FLU/RSV plus assay is intended as an aid in the diagnosis of influenza from Nasopharyngeal swab specimens and should not be used as a sole basis for treatment. Nasal washings and aspirates are unacceptable for Xpert Xpress SARS-CoV-2/FLU/RSV testing.  Fact Sheet for Patients: BloggerCourse.com  Fact Sheet for Healthcare Providers: SeriousBroker.it  This test is not yet approved or cleared by the [place] FDA and has been authorized for detection and/or diagnosis of SARS-CoV-2 by FDA under an Emergency Use Authorization (EUA). This EUA will remain in effect (meaning this test can be used) for the duration of the COVID-19 declaration under Section 564(b)(1) of the Act, 21 U.S.C. section 360bbb-3(b)(1), unless the authorization is terminated or revoked.  Performed at [facility], [address], [place], [place] [address]   HIV Antibody (routine testing w rflx)     Status: None  Collection Time: 08/23/21  9:07 PM  Result Value Ref Range   HIV Screen 4th Generation wRfx Non Reactive Non Reactive    Comment: Performed at [facility], [address], [place], [place] [address]  Valproic acid level      Status: None   Collection Time: 08/24/21  5:24 AM  Result Value Ref Range   Valproic Acid Lvl 69 50.0 - 100.0 ug/mL    Comment: Performed at [facility], [address] [place], [place] [address]  RPR     Status: None   Collection Time: 08/24/21  5:24 AM  Result Value Ref Range   RPR Ser Ql NON REACTIVE NON REACTIVE    Comment: Performed at [facility], [address], [place], [place] [address]  Vitamin B12     Status: None   Collection Time: 08/24/21  5:24 AM  Result Value Ref Range   Vitamin B-12 231 180 - 914 pg/mL    Comment: (NOTE) This assay is not validated for testing neonatal or myeloproliferative syndrome specimens for Vitamin B12 levels. Performed at [facility], [address], [place], [place] [address]   Folate     Status: None   Collection Time: 08/24/21  5:24 AM  Result Value Ref Range   Folate 13.9 >5.9 ng/mL    Comment: Performed at [facility], [address], [place], [place] [address]  Troponin I (High Sensitivity)     Status: None   Collection Time: 08/24/21  5:24 AM  Result Value Ref Range   Troponin I (High Sensitivity) 4 <18 ng/L    Comment: (NOTE) Elevated high sensitivity troponin I (hsTnI) values and significant  changes across serial measurements may suggest ACS but many other  chronic and acute conditions are known to elevate hsTnI results.  Refer to the "Links" section for chest pain algorithms and additional  guidance. Performed at [facility], [address], [place], [place] [address]   Glucose, CSF     Status: None   Collection Time: 08/24/21  1:20 PM  Result Value Ref Range   Glucose, CSF 67 40 - 70 mg/dL    Comment: Performed at [facility], [address] [place], [place] [address]  Protein, CSF     Status: None   Collection Time: 08/24/21  1:20 PM  Result Value Ref Range   Total  Protein, CSF 35 15 - 45 mg/dL    Comment: Performed at [facility], [address] [place],  [place] [address]  CSF cell count with differential     Status: Abnormal   Collection Time: 08/24/21  1:20 PM  Result Value Ref Range   Tube # 3    Color, CSF COLORLESS COLORLESS   Appearance, CSF CLEAR CLEAR   Supernatant NOT INDICATED    RBC Count, CSF 34 (H) 0 /cu mm   WBC, CSF 2 0 - 5 /cu mm   Other Cells, CSF TOO FEW TO COUNT, SMEAR AVAILABLE FOR REVIEW     Comment: RARE LYMPHOCYTES Performed at [facility], [address], [place], [place] [address]   CSF culture w Gram Stain     Status: None (Preliminary result)   Collection Time: 08/24/21  1:20 PM   Specimen: PATH Cytology CSF; Cerebrospinal Fluid  Result Value Ref Range   Specimen Description CSF    Special Requests NONE    Gram Stain NO WBC SEEN  NO ORGANISMS SEEN     Culture      NO GROWTH < 24 HOURS Performed at [facility], [address], [place], [place] [address]    Report Status PENDING   Basic metabolic panel     Status: Abnormal   Collection Time: 08/25/21  4:10 AM  Result Value Ref Range   Sodium 140 135 - 145 mmol/L   Potassium 4.0 3.5 - 5.1 mmol/L   Chloride 108 98 - 111 mmol/L   CO2 24 22 - 32 mmol/L   Glucose, Bld 92 70 - 99 mg/dL    Comment: Glucose reference range applies only to samples taken after fasting for at least 8 hours.   BUN 9 6 - 20 mg/dL   Creatinine, Ser 6.04 0.61 - 1.24 mg/dL   Calcium 8.8 (L) 8.9 - 10.3 mg/dL   GFR, Estimated >54 >09 mL/min    Comment: (NOTE) Calculated using the CKD-EPI Creatinine Equation (2021)    Anion gap 8 5 - 15    Comment: Performed at [facility], [address] [place], [place] [address]  CBC     Status: None   Collection Time: 08/25/21  4:10 AM  Result Value Ref Range   WBC 6.8 4.0 - 10.5 K/uL   RBC 4.37 4.22 - 5.81 MIL/uL   Hemoglobin 13.4 13.0 - 17.0 g/dL   HCT 47.8 29.5 - 62.1 %   MCV 89.5 80.0 - 100.0 fL   MCH 30.7 26.0 - 34.0 pg   MCHC 34.3 30.0 - 36.0 g/dL   RDW 30.8 65.7 - 84.6 %   Platelets 281 150 - 400 K/uL   nRBC 0.0  0.0 - 0.2 %    Comment: Performed at [facility], [address], [place], [place] [address]  Magnesium     Status: None   Collection Time: 08/25/21  4:10 AM  Result Value Ref Range   Magnesium 2.2 1.7 - 2.4 mg/dL    Comment: Performed at [facility], [address], [place], [place] [address]    CT Head Wo Contrast  Result Date: 08/23/2021 CLINICAL DATA:  AMS s/p fall EXAM: CT HEAD WITHOUT CONTRAST TECHNIQUE: Contiguous axial images were obtained from the base of the skull through the vertex without intravenous contrast. COMPARISON:  None. FINDINGS: Brain: No evidence of acute infarction, hemorrhage, hydrocephalus, extra-axial collection or mass lesion/mass effect. Vascular: No hyperdense vessel identified. Skull: Small right frontal scalp contusion without acute fracture. Sinuses/Orbits: Clear visualized sinuses.  Unremarkable orbits. Other: No mastoid effusions. IMPRESSION: 1. No evidence of acute intracranial abnormality. 2. Small right frontal scalp contusion without acute fracture. Electronically Signed   By: Feliberto Harts M.D.   On: 08/23/2021 13:33   MR Brain W and Wo Contrast  Result Date: 08/23/2021 CLINICAL DATA:  Neuro deficit, acute, stroke suspected EXAM: MRI HEAD WITHOUT AND WITH CONTRAST TECHNIQUE: Multiplanar, multiecho pulse sequences of the brain and surrounding structures were obtained without and with intravenous contrast. CONTRAST:  7mL GADAVIST GADOBUTROL 1 MMOL/ML IV SOLN COMPARISON:  CT Head from the same day FINDINGS: Brain: No acute infarction, hemorrhage, hydrocephalus, extra-axial collection or mass lesion. Mild frontal predominant T2 hyperintensities in the white matter. The cerebellar tonsils extend up to 6 mm below the foramen magnum with mildly pointed morphology on the right and mild crowding of the posterior fossa. No abnormal enhancement. Vascular: Major arterial flow voids are maintained skull base. Skull and upper cervical spine: Normal marrow  signal. Sinuses/Orbits: Mild paranasal sinus mucosal thickening. Other: Trace mastoid effusions. IMPRESSION: 1. No acute infarct. 2. The cerebellar tonsils extend up to 6 mm below the foramen magnum with mildly pointed morphology on the right and mild crowding of the posterior fossa. 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 3. Mild frontal predominant T2 hyperintensities in the white matter. This finding is nonspecific but can be seen in the setting of chronic microvascular ischemia, a demyelinating process such as multiple sclerosis, or chronic migraines. Electronically Signed   By: Feliberto Harts M.D.   On: 08/23/2021 18:56   MR Cervical Spine Wo Contrast  Result Date: 08/23/2021 EXAM: MRI CERVICAL SPINE WITHOUT CONTRAST TECHNIQUE: Multiplanar, multisequence MR imaging of the cervical spine was performed. No intravenous contrast was administered. COMPARISON:  None. FINDINGS: Alignment: Straightening of the normal cervical lordosis. No substantial sagittal subluxation. Vertebrae: Vertebral body heights are maintained. No focal marrow edema to suggest acute fracture or discitis/osteomyelitis. Cord: Normal cord signal. Posterior Fossa, vertebral arteries, paraspinal tissues: Please see concurrent MRI brain for evaluation of posterior fossa and description of inferior cerebellar tonsillar descent. Visualized vertebral artery flow voids are maintained. Disc levels: C2-C3: No significant disc protrusion, foraminal stenosis, or canal stenosis. C3-C4: Small right eccentric posterior disc osteophyte complex and right greater than left facet and uncovertebral hypertrophy. Resulting moderate right and mild left foraminal stenosis. Mild canal stenosis. C4-C5: Small central disc protrusion which contacts and flattens the ventral cord. Mild canal stenosis without significant foraminal stenosis. C5-C6: Small central disc protrusion which contacts and flattens the ventral cord. Right greater than left  uncovertebral hypertrophy. Mild canal stenosis without significant foraminal stenosis. C6-C7: Posterior disc osteophyte complex and mild bilateral uncovertebral hypertrophy. Mild bilateral foraminal stenosis and mild canal stenosis. C7-T1: No significant disc protrusion, foraminal stenosis, or canal stenosis. IMPRESSION: 1. Mild canal stenosis at C3-C4, C4-C5, C5-C6, and C6-C7. Disc flattens the ventral cord at C4-C5 and C5-C6. 2. Moderate right and mild left foraminal stenosis at C3-C4. 3. Mild bilateral foraminal stenosis at C6-C7. 4. Please see concurrent MRI brain for description of inferior cerebellar tonsillar descent. Electronically Signed   By: Feliberto Harts M.D.   On: 08/23/2021 19:02   MR THORACIC SPINE WO CONTRAST  Result Date: 08/24/2021 CLINICAL DATA:  Mid back pain with neuro deficit.  Hallucinations EXAM: MRI THORACIC SPINE WITHOUT CONTRAST TECHNIQUE: Multiplanar, multisequence MR imaging of the thoracic spine was performed. No intravenous contrast was administered. COMPARISON:  None. FINDINGS: Alignment:  Physiologic Vertebrae: No fracture, evidence of discitis, or bone lesion. Cord: The central canal is intermittently visible, within normal limits. No evidence of demyelination or mass. Paraspinal and other soft tissues: Negative for perispinal mass or inflammation. Disc levels: Incomplete segmentation at T3-4. Small disc protrusions which are right paracentral at T2-3, central with cord contact at T5-6, and central with ventral cord indentation at T7-8. Focal degenerative facet spurring at T4-5. Diffusely patent foramina and spinal canal. IMPRESSION: 1. No acute finding or specific cause for symptoms. 2. 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 No compressive stenosis. Electronically Signed   By: Tiburcio Pea M.D.   On: 08/24/2021 05:14   MR LUMBAR SPINE WO CONTRAST  Result Date: 08/23/2021 CLINICAL DATA:  Low back pain, progressive neurologic deficit EXAM: MRI  LUMBAR SPINE WITHOUT CONTRAST TECHNIQUE: Multiplanar, multisequence MR imaging of the lumbar spine was performed. No intravenous contrast was administered. COMPARISON:  None. FINDINGS: Segmentation: Transitional lumbosacral anatomy. For the purposes of this dictation, there is  partially lumbarized S1 segment. Alignment: Straightening of the normal lumbar lordosis. No substantial sagittal subluxation. Vertebrae: Vertebral body heights are maintained. No focal marrow edema to suggest acute fracture discitis/osteomyelitis. No suspicious bone lesions. Conus medullaris and cauda equina: Conus extends to the L1-L2 level. Conus appears normal. Paraspinal and other soft tissues: Unremarkable. Disc levels: T12-L1: No significant disc protrusion, foraminal stenosis, or canal stenosis. L1-L2: No significant disc protrusion, foraminal stenosis, or canal stenosis. L2-L3: Slight disc bulging and mild facet arthropathy. No significant canal stenosis. Mild bilateral foraminal stenosis. L3-L4: Disc desiccation height loss. Broad disc bulge and mild bilateral facet hypertrophy. Resulting moderate canal stenosis and mild bilateral foraminal stenosis. L4-L5: Mild disc height loss and desiccation. Broad disc bulge and mild bilateral facet arthropathy. Resulting mild canal stenosis and mild bilateral foraminal stenosis. L5-S1: No significant disc protrusion, foraminal stenosis, or canal stenosis. IMPRESSION: 1. Transitional lumbosacral anatomy. For the purposes of this dictation, there is partially lumbarized S1 segment. Correlation with radiographs is recommended prior to any operative intervention. 2. Moderate canal stenosis at L3-L4.  Mild canal stenosis at L4-L5. 3. Mild bilateral foraminal stenosis at L2-L3, L3-L4, and L4-L5. Electronically Signed   By: Feliberto Harts M.D.   On: 08/23/2021 19:11   EEG adult  Result Date: 08/24/2021 Charlsie Quest, MD     08/24/2021 11:36 AM Patient Name: Lawrence George MRN:  [id number] Epilepsy Attending: Charlsie Quest Referring Physician/Provider: Kara Mead, NP Date: 08/24/2021 Duration: 22.48 mins Patient history: 35 year old male who presents with 6 episodes in the past 5 months of right arm twitching followed by chest clutching with rapid respirations and generalized body shaking with subsequent confusion and lethargy. EEG to evaluate for seizure Level of alertness: Awake, asleep AEDs during EEG study: VPA, Ativan Technical aspects: This EEG study was done with scalp electrodes positioned according to the 10-20 International system of electrode placement. Electrical activity was acquired at a sampling rate of  and reviewed with a high frequency filter of  and a low frequency filter of . EEG data were recorded continuously and digitally stored. Description: The posterior dominant rhythm consists of 10 Hz activity of moderate voltage (25-35 uV) seen predominantly in posterior head regions, symmetric and reactive to eye opening and eye closing. Sleep was characterized by vertex waves, sleep spindles (12 to 14 Hz), maximal frontocentral region. Hyperventilation and photic stimulation were not performed.   IMPRESSION: This study is within normal limits. No seizures or epileptiform discharges were seen throughout the recording. Charlsie Quest   DG FL GUIDED LUMBAR PUNCTURE  Result Date: 08/24/2021 CLINICAL DATA:  Encephalopathy. EXAM: DIAGNOSTIC LUMBAR PUNCTURE UNDER FLUOROSCOPIC GUIDANCE COMPARISON:  MRI lumbar spine 08/23/2021 FLUOROSCOPY TIME:  Fluoroscopy Time:  1 minute 0 second Radiation Exposure Index (if provided by the fluoroscopic device): Number of Acquired Spot Images: 2 PROCEDURE: Informed consent was obtained from the patient prior to the procedure, including potential complications of headache, allergy, and pain. With the patient prone, the lower back was prepped with Betadine. 1% Lidocaine was used for local anesthesia. Lumbar puncture was  performed at the L2-3 level using a 20 gauge needle with return of clear CSF with an opening pressure of 13 cm water. Ten ml of CSF were obtained for laboratory studies. The patient tolerated the procedure well and there were no apparent complications. IMPRESSION: Successful lumbar puncture using fluoroscopy. Electronically Signed   By: Marlan Palau M.D.   On: 08/24/2021 13:49    Review of Systems  Respiratory:  Negative for shortness of breath.   Cardiovascular:  Negative for chest pain.  Gastrointestinal:  Negative for abdominal pain, constipation, diarrhea, nausea and vomiting.  Neurological:  Negative for weakness and headaches.  Psychiatric/Behavioral:  Negative for agitation and suicidal ideas. Self-injury: Left wrist.The patient is not nervous/anxious and is not hyperactive.   Blood pressure 107/81, pulse 65, temperature 98.6 F (37 C), temperature source Oral, resp. rate 13, height 5\' 5"  (1.651 m), weight 72.6 kg, SpO2 100 %. Physical Exam Vitals and nursing note reviewed.  Constitutional:      General: He is not in acute distress.    Appearance: Normal appearance. He is normal weight. He is not ill-appearing or toxic-appearing.  HENT:     Head: Normocephalic and atraumatic.  Pulmonary:     Effort: Pulmonary effort is normal.  Musculoskeletal:        General: Normal range of motion.  Neurological:     Mental Status: He is alert and oriented to person, place, and time.      08/25/21 1023  Presentation  General Appearance Appropriate for Environment;Casual;Fairly Groomed  Eye Contact Good  Speech Clear and Coherent;Normal Rate (Interpretor was used)  Speech Volume Normal  Mood and Affect  Mood Euthymic  Affect Appropriate;Congruent  Thought Processes  Thought Process  (appears coherent - difficult to assess with interpreter. Language conveyed indicated pt was goal oriented)  Descriptions of Associations Intact  Orientation Full (Time, Place and Person)  Thought  Content Logical  Hallucinations None  Ideas of Reference None  Suicidal Thoughts No  Homicidal Thoughts No  Sensorium  Memory Immediate Fair;Recent Fair  Judgment Fair  Insight Fair  Executive Functions  Concentration Good  Attention Span Good  Recall Good  Fund of Knowledge Good  Language Good  Psychomotor Activity  Psychomotor Activity Normal  Assets  Assets Desire for Improvement;Resilience;Social Support  Sleep  Sleep Good     Assessment/Plan: Lawrence George is a 35 y.o. male admitted medically for 08/23/2021 11:29 AM for Seizure-like activity and acute psychosis. He carries the no psychiatric diagnoses and has a past medical history of CAD not on medications. Psychiatry was consulted for Acute Psychosis and a Suicide Attempt (2+ weeks ago in setting of untreated psychosis, unclear if it was a suicide attempt).   He does not meet criteria for inpatient psych admission.  He was not on any psychiatric medications prior to admission. On initial examination, patient laying in bed but pleasant and answer all questions.    As he is currently not having any AVH will not make any medication changes at this time. 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  He should stay on the Risperdal for at least 6 months and if he is seizure and psychosis free at that time could begin to taper. However, because we are starting an antipsychotic we recommend obtaining a Lipid Panel and A1c before discharge. \We have discussed these recommendations with the Primary Care Team.    -Continue Risperdal 1 mg QHS -Obtain Lipid Panel and A1c before discharge - followup has been arranged, pt may choose to follow up through work instead.   Continue rest of care per Primary Team Psychiatry will continue to follow the patient     Lauro Franklin 08/25/2021, 9:45 AM

## 2021-08-25 NOTE — ED Notes (Signed)
Neurology cleared patient; to speak with internal medicine for further dispo.

## 2021-08-25 NOTE — Discharge Summary (Signed)
Physician Discharge Summary  Lawrence George ZOX:[id number] DOB: 19-Mar-1986 DOA: 08/23/2021  PCP: No primary care provider on file.  Admit date: 08/23/2021 Discharge date: 08/25/2021  Admitted From: Home Disposition:  Home  Recommendations for Outpatient Follow-up:  Follow up with PCP in 1-2 weeks Please obtain BMP/CBC in one week your next doctors visit.  Depakote  po bid  Riseperidal  qhd daily  Follow-up outpatient neurology in 3-4 weeks  Discharge Condition: Stable CODE STATUS: Full code Diet recommendation: Regular  Brief/Interim Summary: 35 Spanish-speaking male with history of CAD not on home medication admitted for confusion, auditory and visual hallucination and right-sided headache.  He also recently had a suicide attempt by overdosing on sleeping pills 2 weeks ago.  Patient was seen by neurology in the ER.  CT head, MRI brain was negative for acute infarct but showed mild Chiari malformation.  MRI C-spine, thoracic and lumbar spine showed moderate canal stenosis and small thoracic disc protrusion with osteoarthritis.  Neurology recommended obtaining EEG and lumbar puncture but also started on Depakote 1500 mg loading dose followed by 500 mg twice daily.  EEG was unremarkable and LP was negative.  His psychosis was thought to be secondary to seizure versus alcohol.  He remained stable and his mentation was back to normal prior to discharge.  Patient is medically stable to be discharged with recommendations as stated above.     Assessment & Plan:   Principal Problem:   Psychosis (HCC) Active Problems:   Suicide attempt [facility])   Spinal stenosis   CAD (coronary artery disease)   Acute psychosis (HCC)     Acute psychosis, resolved Concern for seizures -Etiology is currently not clear.  Alcohol, salicylate, Tylenol levels are negative.  UDS and ammonia levels are normal.  Status post CT head, MRI brain, cervical, thoracic and lumbar spine did not show acute  pathology besides canal narrowing. -B12 folate normal.  - Neurology recommended LP and EEG which were both negative - Depakote 500 mg twice daily upon discharge.  Follow-up outpatient neurology in 3-4 weeks. - Lumbar puncture ordered with appropriate labs   Concern for suicide attempt - Patient was seen by psychiatry who added risperidone 1 mg daily at bedtime   Cervical and lumbar spinal canal stenosis, foraminal stenosis -Currently does not have any acute compressive symptoms but due to extent of his spinal disease of benefit from outpatient neurosurgery follow-up   CAD -Currently patient is chest pain-free.   Alcohol use - Not showing any signs of withdrawal.  Counseled to quit using this.        Body mass index is 26.63 kg/m.      Discharge Diagnoses:  Principal Problem:   Postictal psychosis (HCC) Active Problems:   Suicide attempt [facility])   Spinal stenosis   CAD (coronary artery disease)   Acute psychosis [facility])    Consultations: Psychiatry Neurology  Subjective: Patient is back to normal confirmed by his wife was present at bedside.  No complaints.  Discharge Exam: Vitals:   08/25/21 0730 08/25/21 0800  BP: 94/64 107/81  Pulse: (!) 56 65  Resp: 13 13  Temp:    SpO2: 100% 100%   Vitals:   08/25/21 0630 08/25/21 0700 08/25/21 0730 08/25/21 0800  BP: 104/66 110/62 94/64 107/81  Pulse: 67 64 (!) 56 65  Resp: Temp:      TempSrc:      SpO2: 100% 96% 100% 100%  Weight:      Height:  General: Pt is alert, awake, not in acute distress Cardiovascular: RRR, S1/S2 +, no rubs, no gallops Respiratory: CTA bilaterally, no wheezing, no rhonchi Abdominal: Soft, NT, ND, bowel sounds + Extremities: no edema, no cyanosis  Discharge Instructions   Allergies as of 08/25/2021   No Known Allergies      Medication List     STOP taking these medications    ASPIRIN-CAFFEINE PO       TAKE these medications    aspirin EC 81 MG  tablet Take 81 mg by mouth daily. Swallow whole.   divalproex 125 MG capsule Commonly known as: DEPAKOTE SPRINKLE Take 4 capsules (500 mg total) by mouth every 12 (twelve) hours.   OVER THE COUNTER MEDICATION Take 1 Dose by mouth daily. Liquid vitamin memory supplement   risperiDONE 1 MG tablet Commonly known as: RISPERDAL Take 1 tablet (1 mg total) by mouth at bedtime.        No Known Allergies  You were cared for by a hospitalist during your hospital stay. If you have any questions about your discharge medications or the care you received while you were in the hospital after you are discharged, you can call the unit and asked to speak with the hospitalist on call if the hospitalist that took care of you is not available. Once you are discharged, your primary care physician will handle any further medical issues. Please note that no refills for any discharge medications will be authorized once you are discharged, as it is imperative that you return to your primary care physician (or establish a relationship with a primary care physician if you do not have one) for your aftercare needs so that they can reassess your need for medications and monitor your lab values.   Procedures/Studies: CT Head Wo Contrast  Result Date: 08/23/2021 CLINICAL DATA:  AMS s/p fall EXAM: CT HEAD WITHOUT CONTRAST TECHNIQUE: Contiguous axial images were obtained from the base of the skull through the vertex without intravenous contrast. COMPARISON:  None. FINDINGS: Brain: No evidence of acute infarction, hemorrhage, hydrocephalus, extra-axial collection or mass lesion/mass effect. Vascular: No hyperdense vessel identified. Skull: Small right frontal scalp contusion without acute fracture. Sinuses/Orbits: Clear visualized sinuses.  Unremarkable orbits. Other: No mastoid effusions. IMPRESSION: 1. No evidence of acute intracranial abnormality. 2. Small right frontal scalp contusion without acute fracture.  Electronically Signed   By: Feliberto Harts M.D.   On: 08/23/2021 13:33   MR Brain W and Wo Contrast  Result Date: 08/23/2021 CLINICAL DATA:  Neuro deficit, acute, stroke suspected EXAM: MRI HEAD WITHOUT AND WITH CONTRAST TECHNIQUE: Multiplanar, multiecho pulse sequences of the brain and surrounding structures were obtained without and with intravenous contrast. CONTRAST:  7mL GADAVIST GADOBUTROL 1 MMOL/ML IV SOLN COMPARISON:  CT Head from the same day FINDINGS: Brain: No acute infarction, hemorrhage, hydrocephalus, extra-axial collection or mass lesion. Mild frontal predominant T2 hyperintensities in the white matter. The cerebellar tonsils extend up to 6 mm below the foramen magnum with mildly pointed morphology on the right and mild crowding of the posterior fossa. No abnormal enhancement. Vascular: Major arterial flow voids are maintained skull base. Skull and upper cervical spine: Normal marrow signal. Sinuses/Orbits: Mild paranasal sinus mucosal thickening. Other: Trace mastoid effusions. IMPRESSION: 1. No acute infarct. 2. The cerebellar tonsils extend up to 6 mm below the foramen magnum with mildly pointed morphology on the right and mild crowding of the posterior fossa. 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 3. Mild frontal predominant  T2 hyperintensities in the white matter. This finding is nonspecific but can be seen in the setting of chronic microvascular ischemia, a demyelinating process such as multiple sclerosis, or chronic migraines. Electronically Signed   By: Feliberto Harts M.D.   On: 08/23/2021 18:56   MR Cervical Spine Wo Contrast  Result Date: 08/23/2021 EXAM: MRI CERVICAL SPINE WITHOUT CONTRAST TECHNIQUE: Multiplanar, multisequence MR imaging of the cervical spine was performed. No intravenous contrast was administered. COMPARISON:  None. FINDINGS: Alignment: Straightening of the normal cervical lordosis. No substantial sagittal subluxation. Vertebrae: Vertebral  body heights are maintained. No focal marrow edema to suggest acute fracture or discitis/osteomyelitis. Cord: Normal cord signal. Posterior Fossa, vertebral arteries, paraspinal tissues: Please see concurrent MRI brain for evaluation of posterior fossa and description of inferior cerebellar tonsillar descent. Visualized vertebral artery flow voids are maintained. Disc levels: C2-C3: No significant disc protrusion, foraminal stenosis, or canal stenosis. C3-C4: Small right eccentric posterior disc osteophyte complex and right greater than left facet and uncovertebral hypertrophy. Resulting moderate right and mild left foraminal stenosis. Mild canal stenosis. C4-C5: Small central disc protrusion which contacts and flattens the ventral cord. Mild canal stenosis without significant foraminal stenosis. C5-C6: Small central disc protrusion which contacts and flattens the ventral cord. Right greater than left uncovertebral hypertrophy. Mild canal stenosis without significant foraminal stenosis. C6-C7: Posterior disc osteophyte complex and mild bilateral uncovertebral hypertrophy. Mild bilateral foraminal stenosis and mild canal stenosis. C7-T1: No significant disc protrusion, foraminal stenosis, or canal stenosis. IMPRESSION: 1. Mild canal stenosis at C3-C4, C4-C5, C5-C6, and C6-C7. Disc flattens the ventral cord at C4-C5 and C5-C6. 2. Moderate right and mild left foraminal stenosis at C3-C4. 3. Mild bilateral foraminal stenosis at C6-C7. 4. Please see concurrent MRI brain for description of inferior cerebellar tonsillar descent. Electronically Signed   By: Feliberto Harts M.D.   On: 08/23/2021 19:02   MR THORACIC SPINE WO CONTRAST  Result Date: 08/24/2021 CLINICAL DATA:  Mid back pain with neuro deficit.  Hallucinations EXAM: MRI THORACIC SPINE WITHOUT CONTRAST TECHNIQUE: Multiplanar, multisequence MR imaging of the thoracic spine was performed. No intravenous contrast was administered. COMPARISON:  None. FINDINGS:  Alignment:  Physiologic Vertebrae: No fracture, evidence of discitis, or bone lesion. Cord: The central canal is intermittently visible, within normal limits. No evidence of demyelination or mass. Paraspinal and other soft tissues: Negative for perispinal mass or inflammation. Disc levels: Incomplete segmentation at T3-4. Small disc protrusions which are right paracentral at T2-3, central with cord contact at T5-6, and central with ventral cord indentation at T7-8. Focal degenerative facet spurring at T4-5. Diffusely patent foramina and spinal canal. IMPRESSION: 1. No acute finding or specific cause for symptoms. 2. 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 No compressive stenosis. Electronically Signed   By: Tiburcio Pea M.D.   On: 08/24/2021 05:14   MR LUMBAR SPINE WO CONTRAST  Result Date: 08/23/2021 CLINICAL DATA:  Low back pain, progressive neurologic deficit EXAM: MRI LUMBAR SPINE WITHOUT CONTRAST TECHNIQUE: Multiplanar, multisequence MR imaging of the lumbar spine was performed. No intravenous contrast was administered. COMPARISON:  None. FINDINGS: Segmentation: Transitional lumbosacral anatomy. For the purposes of this dictation, there is partially lumbarized S1 segment. Alignment: Straightening of the normal lumbar lordosis. No substantial sagittal subluxation. Vertebrae: Vertebral body heights are maintained. No focal marrow edema to suggest acute fracture discitis/osteomyelitis. No suspicious bone lesions. Conus medullaris and cauda equina: Conus extends to the L1-L2 level. Conus appears normal. Paraspinal and other soft tissues: Unremarkable. Disc levels:  T12-L1: No significant disc protrusion, foraminal stenosis, or canal stenosis. L1-L2: No significant disc protrusion, foraminal stenosis, or canal stenosis. L2-L3: Slight disc bulging and mild facet arthropathy. No significant canal stenosis. Mild bilateral foraminal stenosis. L3-L4: Disc desiccation height loss. Broad disc  bulge and mild bilateral facet hypertrophy. Resulting moderate canal stenosis and mild bilateral foraminal stenosis. L4-L5: Mild disc height loss and desiccation. Broad disc bulge and mild bilateral facet arthropathy. Resulting mild canal stenosis and mild bilateral foraminal stenosis. L5-S1: No significant disc protrusion, foraminal stenosis, or canal stenosis. IMPRESSION: 1. Transitional lumbosacral anatomy. For the purposes of this dictation, there is partially lumbarized S1 segment. Correlation with radiographs is recommended prior to any operative intervention. 2. Moderate canal stenosis at L3-L4.  Mild canal stenosis at L4-L5. 3. Mild bilateral foraminal stenosis at L2-L3, L3-L4, and L4-L5. Electronically Signed   By: Feliberto Harts M.D.   On: 08/23/2021 19:11   EEG adult  Result Date: 08/24/2021 Charlsie Quest, MD     08/24/2021 11:36 AM Patient Name: Donold Marotto MRN: [id number] Epilepsy Attending: Charlsie Quest Referring Physician/Provider: Kara Mead, NP Date: 08/24/2021 Duration: 22.48 mins Patient history: 35 year old male who presents with 6 episodes in the past 5 months of right arm twitching followed by chest clutching with rapid respirations and generalized body shaking with subsequent confusion and lethargy. EEG to evaluate for seizure Level of alertness: Awake, asleep AEDs during EEG study: VPA, Ativan Technical aspects: This EEG study was done with scalp electrodes positioned according to the 10-20 International system of electrode placement. Electrical activity was acquired at a sampling rate of 500Hz  and reviewed with a high frequency filter of 70Hz  and a low frequency filter of 1Hz . EEG data were recorded continuously and digitally stored. Description: The posterior dominant rhythm consists of 10 Hz activity of moderate voltage (25-35 uV) seen predominantly in posterior head regions, symmetric and reactive to eye opening and eye closing. Sleep was characterized by  vertex waves, sleep spindles (12 to 14 Hz), maximal frontocentral region. Hyperventilation and photic stimulation were not performed.   IMPRESSION: This study is within normal limits. No seizures or epileptiform discharges were seen throughout the recording.   DG FL GUIDED LUMBAR PUNCTURE  Result Date: 08/24/2021 CLINICAL DATA:  Encephalopathy. EXAM: DIAGNOSTIC LUMBAR PUNCTURE UNDER FLUOROSCOPIC GUIDANCE COMPARISON:  MRI lumbar spine 08/23/2021 FLUOROSCOPY TIME:  Fluoroscopy Time:  1 minute 0 second Radiation Exposure Index (if provided by the fluoroscopic device): Number of Acquired Spot Images: 2 PROCEDURE: Informed consent was obtained from the patient prior to the procedure, including potential complications of headache, allergy, and pain. With the patient prone, the lower back was prepped with Betadine. 1% Lidocaine was used for local anesthesia. Lumbar puncture was performed at the L2-3 level using a 20 gauge needle with return of clear CSF with an opening pressure of 13 cm water. Ten ml of CSF were obtained for laboratory studies. The patient tolerated the procedure well and there were no apparent complications. IMPRESSION: Successful lumbar puncture using fluoroscopy. Electronically Signed   By: Charlsie Quest M.D.   On: 08/24/2021 13:49     The results of significant diagnostics from this hospitalization (including imaging, microbiology, ancillary and laboratory) are listed below for reference.     Microbiology: Recent Results (from the past 240 hour(s))  Resp Panel by RT-PCR (Flu A&B, Covid) Nasopharyngeal Swab     Status: None   Collection Time: 08/23/21  9:04 PM   Specimen: Nasopharyngeal Swab; Nasopharyngeal(NP)  swabs in vial transport medium  Result Value Ref Range Status   SARS Coronavirus 2 by RT PCR NEGATIVE NEGATIVE Final    Comment: (NOTE) SARS-CoV-2 target nucleic acids are NOT DETECTED.  The SARS-CoV-2 RNA is generally detectable in upper  respiratory specimens during the acute phase of infection. The lowest concentration of SARS-CoV-2 viral copies this assay can detect is 138 copies/mL. A negative result does not preclude SARS-Cov-2 infection and should not be used as the sole basis for treatment or other patient management decisions. A negative result may occur with  improper specimen collection/handling, submission of specimen other than nasopharyngeal swab, presence of viral mutation(s) within the areas targeted by this assay, and inadequate number of viral copies(<138 copies/mL). A negative result must be combined with clinical observations, patient history, and epidemiological information. The expected result is Negative.  Fact Sheet for Patients:  BloggerCourse.com  Fact Sheet for Healthcare Providers:  SeriousBroker.it  This test is no t yet approved or cleared by the [place] FDA and  has been authorized for detection and/or diagnosis of SARS-CoV-2 by FDA under an Emergency Use Authorization (EUA). This EUA will remain  in effect (meaning this test can be used) for the duration of the COVID-19 declaration under Section 564(b)(1) of the Act, 21 U.S.C.section 360bbb-3(b)(1), unless the authorization is terminated  or revoked sooner.       Influenza A by PCR NEGATIVE NEGATIVE Final   Influenza B by PCR NEGATIVE NEGATIVE Final    Comment: (NOTE) The Xpert Xpress SARS-CoV-2/FLU/RSV plus assay is intended as an aid in the diagnosis of influenza from Nasopharyngeal swab specimens and should not be used as a sole basis for treatment. Nasal washings and aspirates are unacceptable for Xpert Xpress SARS-CoV-2/FLU/RSV testing.  Fact Sheet for Patients: BloggerCourse.com  Fact Sheet for Healthcare Providers: SeriousBroker.it  This test is not yet approved or cleared by the [place] FDA and has been  authorized for detection and/or diagnosis of SARS-CoV-2 by FDA under an Emergency Use Authorization (EUA). This EUA will remain in effect (meaning this test can be used) for the duration of the COVID-19 declaration under Section 564(b)(1) of the Act, 21 U.S.C. section 360bbb-3(b)(1), unless the authorization is terminated or revoked.  Performed at [facility], [address], [place], [place] [address]   CSF culture w Gram Stain     Status: None (Preliminary result)   Collection Time: 08/24/21  1:20 PM   Specimen: PATH Cytology CSF; Cerebrospinal Fluid  Result Value Ref Range Status   Specimen Description CSF  Final   Special Requests NONE  Final   Gram Stain NO WBC SEEN NO ORGANISMS SEEN   Final   Culture   Final    NO GROWTH < 24 HOURS Performed at [facility], [address], [place], [place] [address]    Report Status PENDING  Incomplete  Culture, fungus without smear     Status: None (Preliminary result)   Collection Time: 08/24/21  1:20 PM   Specimen: PATH Cytology CSF; Cerebrospinal Fluid  Result Value Ref Range Status   Specimen Description CSF  Final   Special Requests NONE  Final   Culture   Final    NO FUNGUS ISOLATED AFTER 1 DAY Performed at [facility], [address] [place], [place] [address]    Report Status PENDING  Incomplete     Labs: BNP (last 3 results) No results for input(s): BNP in the last 8760 hours. Basic  Metabolic Panel: Recent Labs  Lab 08/23/21 1206 08/25/21 0410  NA 138 140  K 3.8 4.0  CL 105 108  CO2 26 24  GLUCOSE 77 92  BUN 9 9  CREATININE 0.85 0.90  CALCIUM 8.7* 8.8*  MG  --  2.2   Liver Function Tests: Recent Labs  Lab 08/23/21 1206  AST 19  ALT 18  ALKPHOS 63  BILITOT 0.8  PROT 6.8  ALBUMIN 3.9   No results for input(s): LIPASE, AMYLASE in the last 168 hours. Recent Labs  Lab 08/23/21 1207  AMMONIA 29   CBC: Recent Labs  Lab 08/23/21 1206 08/25/21 0410  WBC 7.7 6.8  NEUTROABS 5.4   --   HGB 14.3 13.4  HCT 43.1 39.1  MCV 91.5 89.5  PLT 348 281   Cardiac Enzymes: No results for input(s): CKTOTAL, CKMB, CKMBINDEX, TROPONINI in the last 168 hours. BNP: Invalid input(s): POCBNP CBG: No results for input(s): GLUCAP in the last 168 hours. D-Dimer No results for input(s): DDIMER in the last 72 hours. Hgb A1c No results for input(s): HGBA1C in the last 72 hours. Lipid Profile No results for input(s): CHOL, HDL, LDLCALC, TRIG, CHOLHDL, LDLDIRECT in the last 72 hours. Thyroid function studies No results for input(s): TSH, T4TOTAL, T3FREE, THYROIDAB in the last 72 hours.  Invalid input(s): FREET3 Anemia work up Recent Labs    08/24/21 0524  VITAMINB12 231  FOLATE 13.9   Urinalysis No results found for: COLORURINE, APPEARANCEUR, LABSPEC, PHURINE, GLUCOSEU, HGBUR, BILIRUBINUR, KETONESUR, PROTEINUR, UROBILINOGEN, NITRITE, LEUKOCYTESUR Sepsis Labs Invalid input(s): PROCALCITONIN,  WBC,  LACTICIDVEN Microbiology Recent Results (from the past 240 hour(s))  Resp Panel by RT-PCR (Flu A&B, Covid) Nasopharyngeal Swab     Status: None   Collection Time: 08/23/21  9:04 PM   Specimen: Nasopharyngeal Swab; Nasopharyngeal(NP) swabs in vial transport medium  Result Value Ref Range Status   SARS Coronavirus 2 by RT PCR NEGATIVE NEGATIVE Final    Comment: (NOTE) SARS-CoV-2 target nucleic acids are NOT DETECTED.  The SARS-CoV-2 RNA is generally detectable in upper respiratory specimens during the acute phase of infection. The lowest concentration of SARS-CoV-2 viral copies this assay can detect is 138 copies/mL. A negative result does not preclude SARS-Cov-2 infection and should not be used as the sole basis for treatment or other patient management decisions. A negative result may occur with  improper specimen collection/handling, submission of specimen other than nasopharyngeal swab, presence of viral mutation(s) within the areas targeted by this assay, and inadequate  number of viral copies(<138 copies/mL). A negative result must be combined with clinical observations, patient history, and epidemiological information. The expected result is Negative.  Fact Sheet for Patients:  BloggerCourse.com  Fact Sheet for Healthcare Providers:  SeriousBroker.it  This test is no t yet approved or cleared by the [place] FDA and  has been authorized for detection and/or diagnosis of SARS-CoV-2 by FDA under an Emergency Use Authorization (EUA). This EUA will remain  in effect (meaning this test can be used) for the duration of the COVID-19 declaration under Section 564(b)(1) of the Act, 21 U.S.C.section 360bbb-3(b)(1), unless the authorization is terminated  or revoked sooner.       Influenza A by PCR NEGATIVE NEGATIVE Final   Influenza B by PCR NEGATIVE NEGATIVE Final    Comment: (NOTE) The Xpert Xpress SARS-CoV-2/FLU/RSV plus assay is intended as an aid in the diagnosis of influenza from Nasopharyngeal swab specimens and should not be used as a sole basis for treatment.  Nasal washings and aspirates are unacceptable for Xpert Xpress SARS-CoV-2/FLU/RSV testing.  Fact Sheet for Patients: BloggerCourse.com  Fact Sheet for Healthcare Providers: SeriousBroker.it  This test is not yet approved or cleared by the [place] FDA and has been authorized for detection and/or diagnosis of SARS-CoV-2 by FDA under an Emergency Use Authorization (EUA). This EUA will remain in effect (meaning this test can be used) for the duration of the COVID-19 declaration under Section 564(b)(1) of the Act, 21 U.S.C. section 360bbb-3(b)(1), unless the authorization is terminated or revoked.  Performed at [facility], [address], [place], [place] [address]   CSF culture w Gram Stain     Status: None (Preliminary result)   Collection Time: 08/24/21  1:20 PM    Specimen: PATH Cytology CSF; Cerebrospinal Fluid  Result Value Ref Range Status   Specimen Description CSF  Final   Special Requests NONE  Final   Gram Stain NO WBC SEEN NO ORGANISMS SEEN   Final   Culture   Final    NO GROWTH < 24 HOURS Performed at [facility], [address], [place], [place] [address]    Report Status PENDING  Incomplete  Culture, fungus without smear     Status: None (Preliminary result)   Collection Time: 08/24/21  1:20 PM   Specimen: PATH Cytology CSF; Cerebrospinal Fluid  Result Value Ref Range Status   Specimen Description CSF  Final   Special Requests NONE  Final   Culture   Final    NO FUNGUS ISOLATED AFTER 1 DAY Performed at [facility], [address], [place], [place] [address]    Report Status PENDING  Incomplete     Time coordinating discharge:  I have spent 35 minutes face to face with the patient and on the ward discussing the patients care, assessment, plan and disposition with other care givers. >50% of the time was devoted counseling the patient about the risks and benefits of treatment/Discharge disposition and coordinating care.   SIGNED:   Dimple Nanas, MD  [facility] 08/25/2021, 10:46 AM   If 7PM-7AM, please contact night-coverage

## 2021-08-27 LAB — CSF CULTURE W GRAM STAIN
Culture: NO GROWTH
Gram Stain: NONE SEEN

## 2021-08-27 LAB — CYTOLOGY - NON PAP

## 2021-08-28 LAB — VDRL, CSF: VDRL Quant, CSF: NONREACTIVE

## 2021-08-28 LAB — VITAMIN B1: Vitamin B1 (Thiamine): 165.8 nmol/L (ref 66.5–200.0)

## 2021-09-14 LAB — CULTURE, FUNGUS WITHOUT SMEAR

## 2021-09-15 ENCOUNTER — Telehealth: Payer: Self-pay

## 2021-09-15 NOTE — Telephone Encounter (Signed)
Post ED Visit - Positive Culture Follow-up  Culture report reviewed by antimicrobial stewardship pharmacist: [facility] [x]  , Pharm.D. BCCCP []  Loleta Dicker, Pharm.D., BCPS AQ-ID []  , Pharm.D., BCPS []  Celedonio Miyamoto, Pharm.D., BCPS []  Valencia West, Garvin Fila.D., BCPS, AAHIVP []  , Pharm.D., BCPS, AAHIVP []  Georgina Pillion, PharmD, BCPS []  , PharmD, BCPS []  [place], PharmD, BCPS []  [address], PharmD []  , PharmD, BCPS []  Estella Husk, PharmD  Pharmacy Team []  Lysle Pearl, PharmD []  , PharmD []  Phillips Climes, PharmD []  , Rph []  Agapito Games) , PharmD []  Verlan Friends, PharmD []  , PharmD []  Mervyn Gay, PharmD []  , PharmD []  Vinnie Level, PharmD []  [facility], PharmD []  , PharmD []  Len Childs, PharmD   Negative fungus culture No treatment, no further patient follow-up is required at this time.  09/15/2021, 9:59 AM

## 2021-09-18 ENCOUNTER — Other Ambulatory Visit (HOSPITAL_COMMUNITY): Payer: Self-pay
